# Patient Record
Sex: Female | Born: 1970 | Race: White | Hispanic: No | Smoking: Never smoker
Health system: Southern US, Community
[De-identification: ages and names within clinical notes are randomized; demographics above are authoritative.]

## PROBLEM LIST (undated history)

## (undated) DIAGNOSIS — K838 Other specified diseases of biliary tract: Secondary | ICD-10-CM

## (undated) DIAGNOSIS — K259 Gastric ulcer, unspecified as acute or chronic, without hemorrhage or perforation: Secondary | ICD-10-CM

## (undated) DIAGNOSIS — F329 Major depressive disorder, single episode, unspecified: Secondary | ICD-10-CM

## (undated) DIAGNOSIS — F419 Anxiety disorder, unspecified: Secondary | ICD-10-CM

## (undated) DIAGNOSIS — F32A Depression, unspecified: Secondary | ICD-10-CM

## (undated) DIAGNOSIS — Z9889 Other specified postprocedural states: Secondary | ICD-10-CM

## (undated) DIAGNOSIS — R112 Nausea with vomiting, unspecified: Secondary | ICD-10-CM

## (undated) HISTORY — PX: CHOLECYSTECTOMY: SHX55

## (undated) HISTORY — PX: ROTATOR CUFF REPAIR: SHX139

## (undated) HISTORY — PX: JOINT REPLACEMENT: SHX530

## (undated) HISTORY — PX: APPENDECTOMY: SHX54

## (undated) HISTORY — PX: WRIST FRACTURE SURGERY: SHX121

---

## 2016-06-06 ENCOUNTER — Ambulatory Visit: Payer: Self-pay | Admitting: Orthopedic Surgery

## 2016-06-06 NOTE — H&P (Signed)
Felicia Gordon DOB: 06/01/1970 Separated / Language: [place] / Race: White Female Date of Admission:  06/25/2016  CC:  Left knee instability History of Present Illness  The patient is a 46 year old female who comes in for a preoperative History and Physical. The patient is scheduled for a left total knee arthroplasty (revision) to be performed by Dr. Gus RankinFrank V. Aluisio, MD at [facility] on 06-25-2016. The patient is a 46 year old female who presented for follow up of their knee. The patient is being followed for their left knee pain and s/p TKA by Dr Felicia Gordon. Symptoms reported include: pain. The patient feels that they are doing well (feels better when wearing the brace). Current treatment includes: bracing (economy). Felicia DikeJennifer has been seen and followed in the clininc and it was felt as though she had an unstable total knee. We treated her with bracing and she feels it when she is wearing the brace, the knee is more comfortable. It is cumbersome to use the brace and when she does not wear it at night her knee really is unstable when she moves in bed. She does not like to wear the brace at night because it becomes uncomfortable. She is at a stage now where she feels she needs to have something done with the knee to allow her to get back to doing activities that she desires. She is encouraged that the brace helped and encouraged that if we could correct the instability that she will be able to do a lot more and tolerate this better. She is ready to proceed with surgery at this time. They have been treated conservatively in the past for the above stated problem and despite conservative measures, they continue to have progressive pain and severe functional limitations and dysfunction. They have failed non-operative management including home exercise, medications, and bracing. It is felt that they would benefit from undergoing revision of the total joint replacement. Risks and benefits of the  procedure have been discussed with the patient and they elect to proceed with surgery. There are no active contraindications to surgery such as ongoing infection or rapidly progressive neurological disease.  Problem List/Past Medical  Chronic pain of left knee (M25.562)  Chronic Ulcerative Colitis  Anxiety Disorder  Shingles  Depression  Osteopenia  Allergies NSAIDs  GI intolerance  Family History Chronic Obstructive Lung Disease  Maternal Grandfather. Depression  Mother. Heart Disease  Maternal Grandmother. Hypertension  Father, Mother. Osteoarthritis  Mother. Osteoporosis  Mother. Rheumatoid Arthritis  Sister.  Social History Children  1 Current drinker  12/07/2015: Currently drinks wine only occasionally per week Current work status  working full time Exercise  Exercises weekly; does individual sport Living situation  live alone Marital status  divorced No history of drug/alcohol rehab  Not under pain contract  Number of flights of stairs before winded  4-5 Tobacco / smoke exposure  12/07/2015: no Tobacco use  Never smoker. 12/07/2015  Medication History  Benadryl (25MG  Tablet, Oral) Active. TraZODone HCl (50MG  Tablet, Oral) Active. Atacand (8MG  Tablet, Oral) Active. Xanax (1MG  Tablet, Oral) Active. BuPROPion HCl ER (XL) (150MG  Tablet ER 24HR, Oral) Active. FLUoxetine HCl (20MG  Capsule, Oral) Active. Singulair (10MG  Tablet, Oral) Active. Myzilra (Oral) Active. ValACYclovir HCl (500MG  Tablet, Oral) Active. Omeprazole (40MG  Capsule DR, Oral) Active. Hydrocodone-Acetaminophen (5-325MG  Tablet, Oral) Active. Gabapentin (300MG  Capsule, Oral) Active. Montelukast Sodium (10MG  Tablet, Oral) Active.   Past Surgical History Appendectomy  Arthroscopy of Knee  left Arthroscopy of Shoulder  right Gallbladder Surgery  laporoscopic Rotator Cuff Repair  right Total Knee Replacement  left   Review of Systems General Not Present-  Chills, Fatigue, Fever, Memory Loss, Night Sweats, Weight Gain and Weight Loss. Skin Not Present- Eczema, Hives, Itching, Lesions and Rash. HEENT Not Present- Dentures, Double Vision, Headache, Hearing Loss, Tinnitus and Visual Loss. Respiratory Not Present- Allergies, Chronic Cough, Coughing up blood, Shortness of breath at rest and Shortness of breath with exertion. Cardiovascular Not Present- Chest Pain, Difficulty Breathing Lying Down, Murmur, Palpitations, Racing/skipping heartbeats and Swelling. Gastrointestinal Not Present- Abdominal Pain, Bloody Stool, Constipation, Diarrhea, Difficulty Swallowing, Heartburn, Jaundice, Loss of appetitie, Nausea and Vomiting. Female Genitourinary Not Present- Blood in Urine, Discharge, Flank Pain, Incontinence, Painful Urination, Urgency, Urinary frequency, Urinary Retention, Urinating at Night and Weak urinary stream. Musculoskeletal Present- Joint Pain. Not Present- Back Pain, Joint Swelling, Morning Stiffness, Muscle Pain, Muscle Weakness and Spasms. Neurological Not Present- Blackout spells, Difficulty with balance, Dizziness, Paralysis, Tremor and Weakness. Psychiatric Not Present- Insomnia.  Vitals Weight: 155 lb Height: 65in Weight was reported by patient. Height was reported by patient. Body Surface Area: 1.78 m Body Mass Index: 25.79 kg/m  Pulse: 76 (Regular)  BP: 124/86 (Sitting, Right Arm, Standard)  Physical Exam General Mental Status -Alert, cooperative and good historian. General Appearance-pleasant, Not in acute distress. Orientation-Oriented X3. Build & Nutrition-Well nourished and Well developed.  Head and Neck Head-normocephalic, atraumatic . Neck Global Assessment - supple, no bruit auscultated on the right, no bruit auscultated on the left.  Eye Pupil - Bilateral-Regular and Round. Motion - Bilateral-EOMI.  Chest and Lung Exam Auscultation Breath sounds - clear at anterior chest wall and clear  at posterior chest wall. Adventitious sounds - No Adventitious sounds.  Cardiovascular Auscultation Rhythm - Regular rate and rhythm. Heart Sounds - S1 WNL and S2 WNL. Murmurs & Other Heart Sounds - Auscultation of the heart reveals - No Murmurs.  Abdomen Palpation/Percussion Tenderness - Abdomen is non-tender to palpation. Rigidity (guarding) - Abdomen is soft. Auscultation Auscultation of the abdomen reveals - Bowel sounds normal.  Female Genitourinary Note: Not done, not pertinent to present illness   Musculoskeletal Note: On exam, she is alert and oriented, in no apparent distress. Left knee shows about 5 to 10 degrees of hyperextension, flexion down to about 125 or 130. She has a significant amount of varus, valgus and AP laxity on range of motion.  Assessment & Plan  Aftercare following left knee joint replacement surgery (Z47.1, [id number]) Chronic pain of left knee (M25.562, G89.29)  Note:Surgical Plans: Left Polyethylene Revision versus Left Total Knee Revision  Disposition: Home with family. She would [facility] straight [facility] surgery. RX provided to the patient to setup and start on Monday, February 26th.  PCP: Dr. Cranford MonNacouzi - Patient has been seen preoperatively and felt to be stable for surgery.  IV TXA  Anesthesia Issues: None  Patient was instructed on what medications to stop prior to surgery.  Signed electronically by Beckey RutterAlezandrew L Adasyn Mcadams, III PA-C

## 2016-06-12 ENCOUNTER — Ambulatory Visit: Payer: Self-pay | Admitting: Orthopedic Surgery

## 2016-06-12 NOTE — Progress Notes (Signed)
Preop on 2/13.  Need orders in epic .  Thank You

## 2016-06-13 ENCOUNTER — Ambulatory Visit: Payer: Self-pay | Admitting: Orthopedic Surgery

## 2016-06-16 NOTE — Patient Instructions (Signed)
Felicia Gordon  06/16/2016   Your procedure is scheduled on: 06/25/2016    Report to [facility]  Entrance [place]  elevators to 3rd floor to  Short Stay Center at     0715 AM.  Call this number if you have problems the morning of surgery [phone]   Remember: ONLY 1 PERSON MAY GO WITH YOU TO SHORT STAY TO GET  READY MORNING OF YOUR SURGERY.  Do not eat food or drink liquids :After Midnight.     Take these medicines the morning of surgery with A SIP OF WATER: hydrocodone if needed, prilosec                                 You may not have any metal on your body including hair pins and              piercings  Do not wear jewelry, make-up, lotions, powders or perfumes, deodorant             Do not wear nail polish.  Do not shave  48 hours prior to surgery.     Do not bring valuables to the hospital. [facility] IS NOT             RESPONSIBLE   FOR VALUABLES.  Contacts, dentures or bridgework may not be worn into surgery.  Leave suitcase in the car. After surgery it may be brought to your room.                     Please read over the following fact sheets you were given: _____________________________________________________________________             [facility] - Preparing for Surgery Before surgery, you can play an important role.  Because skin is not sterile, your skin needs to be as free of germs as possible.  You can reduce the number of germs on your skin by washing with CHG (chlorahexidine gluconate) soap before surgery.  CHG is an antiseptic cleaner which kills germs and bonds with the skin to continue killing germs even after washing. Please DO NOT use if you have an allergy to CHG or antibacterial soaps.  If your skin becomes reddened/irritated stop using the CHG and inform your nurse when you arrive at Short Stay. Do not shave (including legs and underarms) for at least 48 hours prior to the first CHG shower.  You may shave your  face/neck. Please follow these instructions carefully:  1.  Shower with CHG Soap the night before surgery and the  morning of Surgery.  2.  If you choose to wash your hair, wash your hair first as usual with your  normal  shampoo.  3.  After you shampoo, rinse your hair and body thoroughly to remove the  shampoo.                           4.  Use CHG as you would any other liquid soap.  You can apply chg directly  to the skin and wash                       Gently with a scrungie or clean washcloth.  5.  Apply the CHG Soap to your body ONLY FROM  THE NECK DOWN.   Do not use on face/ open                           Wound or open sores. Avoid contact with eyes, ears mouth and genitals (private parts).                       Wash face,  Genitals (private parts) with your normal soap.             6.  Wash thoroughly, paying special attention to the area where your surgery  will be performed.  7.  Thoroughly rinse your body with warm water from the neck down.  8.  DO NOT shower/wash with your normal soap after using and rinsing off  the CHG Soap.                9.  Pat yourself dry with a clean towel.            10.  Wear clean pajamas.            11.  Place clean sheets on your bed the night of your first shower and do not  sleep with pets. Day of Surgery : Do not apply any lotions/deodorants the morning of surgery.  Please wear clean clothes to the hospital/surgery center.  FAILURE TO FOLLOW THESE INSTRUCTIONS MAY RESULT IN THE CANCELLATION OF YOUR SURGERY PATIENT SIGNATURE_________________________________  NURSE SIGNATURE__________________________________  ________________________________________________________________________  WHAT IS A BLOOD TRANSFUSION? Blood Transfusion Information  A transfusion is the replacement of blood or some of its parts. Blood is made up of multiple cells which provide different functions.  Red blood cells carry oxygen and are used for blood loss  replacement.  White blood cells fight against infection.  Platelets control bleeding.  Plasma helps clot blood.  Other blood products are available for specialized needs, such as hemophilia or other clotting disorders. BEFORE THE TRANSFUSION  Who gives blood for transfusions?   Healthy volunteers who are fully evaluated to make sure their blood is safe. This is blood bank blood. Transfusion therapy is the safest it has ever been in the practice of medicine. Before blood is taken from a donor, a complete history is taken to make sure that person has no history of diseases nor engages in risky social behavior (examples are intravenous drug use or sexual activity with multiple partners). The donor's travel history is screened to minimize risk of transmitting infections, such as malaria. The donated blood is tested for signs of infectious diseases, such as HIV and hepatitis. The blood is then tested to be sure it is compatible with you in order to minimize the chance of a transfusion reaction. If you or a relative donates blood, this is often done in anticipation of surgery and is not appropriate for emergency situations. It takes many days to process the donated blood. RISKS AND COMPLICATIONS Although transfusion therapy is very safe and saves many lives, the main dangers of transfusion include:   Getting an infectious disease.  Developing a transfusion reaction. This is an allergic reaction to something in the blood you were given. Every precaution is taken to prevent this. The decision to have a blood transfusion has been considered carefully by your caregiver before blood is given. Blood is not given unless the benefits outweigh the risks. AFTER THE TRANSFUSION  Right after receiving a blood transfusion, you will usually feel much better and  more energetic. This is especially true if your red blood cells have gotten low (anemic). The transfusion raises the level of the red blood cells which  carry oxygen, and this usually causes an energy increase.  The nurse administering the transfusion will monitor you carefully for complications. HOME CARE INSTRUCTIONS  No special instructions are needed after a transfusion. You may find your energy is better. Speak with your caregiver about any limitations on activity for underlying diseases you may have. SEEK MEDICAL CARE IF:   Your condition is not improving after your transfusion.  You develop redness or irritation at the intravenous (IV) site. SEEK IMMEDIATE MEDICAL CARE IF:  Any of the following symptoms occur over the next 12 hours:  Shaking chills.  You have a temperature by mouth above 102 F (38.9 C), not controlled by medicine.  Chest, back, or muscle pain.  People around you feel you are not acting correctly or are confused.  Shortness of breath or difficulty breathing.  Dizziness and fainting.  You get a rash or develop hives.  You have a decrease in urine output.  Your urine turns a dark color or changes to pink, red, or brown. Any of the following symptoms occur over the next 10 days:  You have a temperature by mouth above 102 F (38.9 C), not controlled by medicine.  Shortness of breath.  Weakness after normal activity.  The white part of the eye turns yellow (jaundice).  You have a decrease in the amount of urine or are urinating less often.  Your urine turns a dark color or changes to pink, red, or brown. Document Released: 04/18/2000 Document Revised: 07/14/2011 Document Reviewed: 12/06/2007 ExitCare Patient Information 2014 [place].  _______________________________________________________________________  Incentive Spirometer  An incentive spirometer is a tool that can help keep your lungs clear and active. This tool measures how well you are filling your lungs with each breath. Taking long deep breaths may help reverse or decrease the chance of developing breathing (pulmonary) problems  (especially infection) following:  A long period of time when you are unable to move or be active. BEFORE THE PROCEDURE   If the spirometer includes an indicator to show your best effort, your nurse or respiratory therapist will set it to a desired goal.  If possible, sit up straight or lean slightly forward. Try not to slouch.  Hold the incentive spirometer in an upright position. INSTRUCTIONS FOR USE  1. Sit on the edge of your bed if possible, or sit up as far as you can in bed or on a chair. 2. Hold the incentive spirometer in an upright position. 3. Breathe out normally. 4. Place the mouthpiece in your mouth and seal your lips tightly around it. 5. Breathe in slowly and as deeply as possible, raising the piston or the ball toward the top of the column. 6. Hold your breath for 3-5 seconds or for as long as possible. Allow the piston or ball to fall to the bottom of the column. 7. Remove the mouthpiece from your mouth and breathe out normally. 8. Rest for a few seconds and repeat Steps 1 through 7 at least 10 times every 1-2 hours when you are awake. Take your time and take a few normal breaths between deep breaths. 9. The spirometer may include an indicator to show your best effort. Use the indicator as a goal to work toward during each repetition. 10. After each set of 10 deep breaths, practice coughing to be sure your lungs  are clear. If you have an incision (the cut made at the time of surgery), support your incision when coughing by placing a pillow or rolled up towels firmly against it. Once you are able to get out of bed, walk around indoors and cough well. You may stop using the incentive spirometer when instructed by your caregiver.  RISKS AND COMPLICATIONS  Take your time so you do not get dizzy or light-headed.  If you are in pain, you may need to take or ask for pain medication before doing incentive spirometry. It is harder to take a deep breath if you are having  pain. AFTER USE  Rest and breathe slowly and easily.  It can be helpful to keep track of a log of your progress. Your caregiver can provide you with a simple table to help with this. If you are using the spirometer at home, follow these instructions: [place] IF:   You are having difficultly using the spirometer.  You have trouble using the spirometer as often as instructed.  Your pain medication is not giving enough relief while using the spirometer.  You develop fever of 100.5 F (38.1 C) or higher. SEEK IMMEDIATE MEDICAL CARE IF:   You cough up bloody sputum that had not been present before.  You develop fever of 102 F (38.9 C) or greater.  You develop worsening pain at or near the incision site. MAKE SURE YOU:   Understand these instructions.  Will watch your condition.  Will get help right away if you are not doing well or get worse. Document Released: 09/01/2006 Document Revised: 07/14/2011 Document Reviewed: 11/02/2006 [facility] Patient Information 2014 [place], [place].   ________________________________________________________________________

## 2016-06-17 ENCOUNTER — Inpatient Hospital Stay (HOSPITAL_COMMUNITY)
Admission: RE | Admit: 2016-06-17 | Discharge: 2016-06-17 | Disposition: A | Discharge status: Home / Self Care | Payer: Self-pay | Source: Ambulatory Visit

## 2016-06-17 NOTE — Patient Instructions (Addendum)
Felicia Gordon  06/17/2016   Your procedure is scheduled on: 06/25/16  [facility]  Entrance [place]  elevators to 3rd floor to  Short Stay Center at     [phone].  Call this number if you have problems the morning of surgery [phone]   Remember: ONLY 1 PERSON MAY GO WITH YOU TO SHORT STAY TO GET  READY MORNING OF YOUR SURGERY.  Do not eat food or drink liquids :After Midnight.     Take these medicines the morning of surgery with A SIP OF WATER: prilosec, hydrocodone if needed,                                 You may not have any metal on your body including hair pins and              piercings  Do not wear jewelry, make-up, lotions, powders or perfumes, deodorant             Do not wear nail polish.  Do not shave  48 hours prior to surgery.               Do not bring valuables to the hospital. [facility] IS NOT             RESPONSIBLE   FOR VALUABLES.  Contacts, dentures or bridgework may not be worn into surgery.  Leave suitcase in the car. After surgery it may be brought to your room.                  Please read over the following fact sheets you were given: _____________________________________________________________________             [facility] Pre[facility] Before surgery, you can play an important role.  Because skin is not sterile, your skin needs to be as free of germs as possible.  You can reduce the number of germs on your skin by washing with CHG (chlorahexidine gluconate) soap before surgery.  CHG is an antiseptic cleaner which kills germs and bonds with the skin to continue killing germs even after washing. Please DO NOT use if you have an allergy to CHG or antibacterial soaps.  If your skin becomes reddened/irritated stop using the CHG and inform your nurse when you arrive at Short Stay. Do not shave (including legs and underarms) for at least 48 hours prior to the first CHG shower.  You may shave your  face/neck. Please follow these instructions carefully:  1.  Shower with CHG Soap the night before surgery and the  morning of Surgery.  2.  If you choose to wash your hair, wash your hair first as usual with your  normal  shampoo.  3.  After you shampoo, rinse your hair and body thoroughly to remove the  shampoo.                           4.  Use CHG as you would any other liquid soap.  You can apply chg directly  to the skin and wash                       Gently with a scrungie or clean washcloth.  5.  Apply the CHG Soap to  your body ONLY FROM THE NECK DOWN.   Do not use on face/ open                           Wound or open sores. Avoid contact with eyes, ears mouth and genitals (private parts).                       Wash face,  Genitals (private parts) with your normal soap.             6.  Wash thoroughly, paying special attention to the area where your surgery  will be performed.  7.  Thoroughly rinse your body with warm water from the neck down.  8.  DO NOT shower/wash with your normal soap after using and rinsing off  the CHG Soap.                9.  Pat yourself dry with a clean towel.            10.  Wear clean pajamas.            11.  Place clean sheets on your bed the night of your first shower and do not  sleep with pets. Day of Surgery : Do not apply any lotions/deodorants the morning of surgery.  Please wear clean clothes to the hospital/surgery center.  FAILURE TO FOLLOW THESE INSTRUCTIONS MAY RESULT IN THE CANCELLATION OF YOUR SURGERY PATIENT SIGNATURE_________________________________  NURSE SIGNATURE__________________________________  ________________________________________________________________________  WHAT IS A BLOOD TRANSFUSION? Blood Transfusion Information  A transfusion is the replacement of blood or some of its parts. Blood is made up of multiple cells which provide different functions.  Red blood cells carry oxygen and are used for blood loss  replacement.  White blood cells fight against infection.  Platelets control bleeding.  Plasma helps clot blood.  Other blood products are available for specialized needs, such as hemophilia or other clotting disorders. BEFORE THE TRANSFUSION  Who gives blood for transfusions?   Healthy volunteers who are fully evaluated to make sure their blood is safe. This is blood bank blood. Transfusion therapy is the safest it has ever been in the practice of medicine. Before blood is taken from a donor, a complete history is taken to make sure that person has no history of diseases nor engages in risky social behavior (examples are intravenous drug use or sexual activity with multiple partners). The donor's travel history is screened to minimize risk of transmitting infections, such as malaria. The donated blood is tested for signs of infectious diseases, such as HIV and hepatitis. The blood is then tested to be sure it is compatible with you in order to minimize the chance of a transfusion reaction. If you or a relative donates blood, this is often done in anticipation of surgery and is not appropriate for emergency situations. It takes many days to process the donated blood. RISKS AND COMPLICATIONS Although transfusion therapy is very safe and saves many lives, the main dangers of transfusion include:   Getting an infectious disease.  Developing a transfusion reaction. This is an allergic reaction to something in the blood you were given. Every precaution is taken to prevent this. The decision to have a blood transfusion has been considered carefully by your caregiver before blood is given. Blood is not given unless the benefits outweigh the risks. AFTER THE TRANSFUSION  Right after receiving a blood transfusion, you will usually  feel much better and more energetic. This is especially true if your red blood cells have gotten low (anemic). The transfusion raises the level of the red blood cells which  carry oxygen, and this usually causes an energy increase.  The nurse administering the transfusion will monitor you carefully for complications. HOME CARE INSTRUCTIONS  No special instructions are needed after a transfusion. You may find your energy is better. Speak with your caregiver about any limitations on activity for underlying diseases you may have. SEEK MEDICAL CARE IF:   Your condition is not improving after your transfusion.  You develop redness or irritation at the intravenous (IV) site. SEEK IMMEDIATE MEDICAL CARE IF:  Any of the following symptoms occur over the next 12 hours:  Shaking chills.  You have a temperature by mouth above 102 F (38.9 C), not controlled by medicine.  Chest, back, or muscle pain.  People around you feel you are not acting correctly or are confused.  Shortness of breath or difficulty breathing.  Dizziness and fainting.  You get a rash or develop hives.  You have a decrease in urine output.  Your urine turns a dark color or changes to pink, red, or brown. Any of the following symptoms occur over the next 10 days:  You have a temperature by mouth above 102 F (38.9 C), not controlled by medicine.  Shortness of breath.  Weakness after normal activity.  The white part of the eye turns yellow (jaundice).  You have a decrease in the amount of urine or are urinating less often.  Your urine turns a dark color or changes to pink, red, or brown. Document Released: 04/18/2000 Document Revised: 07/14/2011 Document Reviewed: 12/06/2007 ExitCare Patient Information 2014 [place].  _______________________________________________________________________  Incentive Spirometer  An incentive spirometer is a tool that can help keep your lungs clear and active. This tool measures how well you are filling your lungs with each breath. Taking long deep breaths may help reverse or decrease the chance of developing breathing (pulmonary) problems  (especially infection) following:  A long period of time when you are unable to move or be active. BEFORE THE PROCEDURE   If the spirometer includes an indicator to show your best effort, your nurse or respiratory therapist will set it to a desired goal.  If possible, sit up straight or lean slightly forward. Try not to slouch.  Hold the incentive spirometer in an upright position. INSTRUCTIONS FOR USE  1. Sit on the edge of your bed if possible, or sit up as far as you can in bed or on a chair. 2. Hold the incentive spirometer in an upright position. 3. Breathe out normally. 4. Place the mouthpiece in your mouth and seal your lips tightly around it. 5. Breathe in slowly and as deeply as possible, raising the piston or the ball toward the top of the column. 6. Hold your breath for 3-5 seconds or for as long as possible. Allow the piston or ball to fall to the bottom of the column. 7. Remove the mouthpiece from your mouth and breathe out normally. 8. Rest for a few seconds and repeat Steps 1 through 7 at least 10 times every 1-2 hours when you are awake. Take your time and take a few normal breaths between deep breaths. 9. The spirometer may include an indicator to show your best effort. Use the indicator as a goal to work toward during each repetition. 10. After each set of 10 deep breaths, practice coughing to  be sure your lungs are clear. If you have an incision (the cut made at the time of surgery), support your incision when coughing by placing a pillow or rolled up towels firmly against it. Once you are able to get out of bed, walk around indoors and cough well. You may stop using the incentive spirometer when instructed by your caregiver.  RISKS AND COMPLICATIONS  Take your time so you do not get dizzy or light-headed.  If you are in pain, you may need to take or ask for pain medication before doing incentive spirometry. It is harder to take a deep breath if you are having  pain. AFTER USE  Rest and breathe slowly and easily.  It can be helpful to keep track of a log of your progress. Your caregiver can provide you with a simple table to help with this. If you are using the spirometer at home, follow these instructions: [place] IF:   You are having difficultly using the spirometer.  You have trouble using the spirometer as often as instructed.  Your pain medication is not giving enough relief while using the spirometer.  You develop fever of 100.5 F (38.1 C) or higher. SEEK IMMEDIATE MEDICAL CARE IF:   You cough up bloody sputum that had not been present before.  You develop fever of 102 F (38.9 C) or greater.  You develop worsening pain at or near the incision site. MAKE SURE YOU:   Understand these instructions.  Will watch your condition.  Will get help right away if you are not doing well or get worse. Document Released: 09/01/2006 Document Revised: 07/14/2011 Document Reviewed: 11/02/2006 [facility] Patient Information 2014 [place], [place].   ________________________________________________________________________

## 2016-06-19 ENCOUNTER — Encounter (HOSPITAL_COMMUNITY)
Admission: RE | Admit: 2016-06-19 | Discharge: 2016-06-19 | Disposition: A | Discharge status: Home / Self Care | Payer: BLUE CROSS/BLUE SHIELD | Source: Ambulatory Visit | Attending: Orthopedic Surgery | Admitting: Orthopedic Surgery

## 2016-06-19 ENCOUNTER — Ambulatory Visit (HOSPITAL_COMMUNITY)
Admission: RE | Admit: 2016-06-19 | Discharge: 2016-06-19 | Disposition: A | Discharge status: Home / Self Care | Payer: BLUE CROSS/BLUE SHIELD | Source: Ambulatory Visit | Attending: Anesthesiology | Admitting: Anesthesiology

## 2016-06-19 ENCOUNTER — Encounter (HOSPITAL_COMMUNITY): Payer: Self-pay

## 2016-06-19 ENCOUNTER — Encounter (INDEPENDENT_AMBULATORY_CARE_PROVIDER_SITE_OTHER): Payer: Self-pay

## 2016-06-19 DIAGNOSIS — R05 Cough: Secondary | ICD-10-CM | POA: Diagnosis present

## 2016-06-19 DIAGNOSIS — R059 Cough, unspecified: Secondary | ICD-10-CM

## 2016-06-19 HISTORY — DX: Nausea with vomiting, unspecified: R11.2

## 2016-06-19 HISTORY — DX: Other specified postprocedural states: Z98.890

## 2016-06-19 HISTORY — DX: Major depressive disorder, single episode, unspecified: F32.9

## 2016-06-19 HISTORY — DX: Anxiety disorder, unspecified: F41.9

## 2016-06-19 HISTORY — DX: Depression, unspecified: F32.A

## 2016-06-19 HISTORY — DX: Gastric ulcer, unspecified as acute or chronic, without hemorrhage or perforation: K25.9

## 2016-06-19 LAB — COMPREHENSIVE METABOLIC PANEL
ALT: 67 U/L — ABNORMAL HIGH (ref 14–54)
AST: 33 U/L (ref 15–41)
Albumin: 4 g/dL (ref 3.5–5.0)
Alkaline Phosphatase: 62 U/L (ref 38–126)
Anion gap: 10 (ref 5–15)
BUN: 9 mg/dL (ref 6–20)
CHLORIDE: 103 mmol/L (ref 101–111)
CO2: 30 mmol/L (ref 22–32)
CREATININE: 0.62 mg/dL (ref 0.44–1.00)
Calcium: 9 mg/dL (ref 8.9–10.3)
Glucose, Bld: 97 mg/dL (ref 65–99)
POTASSIUM: 3.5 mmol/L (ref 3.5–5.1)
Sodium: 143 mmol/L (ref 135–145)
TOTAL PROTEIN: 6.8 g/dL (ref 6.5–8.1)
Total Bilirubin: 0.4 mg/dL (ref 0.3–1.2)

## 2016-06-19 LAB — APTT: aPTT: 28 seconds (ref 24–36)

## 2016-06-19 LAB — HCG, SERUM, QUALITATIVE: Preg, Serum: NEGATIVE

## 2016-06-19 LAB — PROTIME-INR
INR: 0.96
PROTHROMBIN TIME: 12.8 s (ref 11.4–15.2)

## 2016-06-19 LAB — CBC
HCT: 40.3 % (ref 36.0–46.0)
Hemoglobin: 13.6 g/dL (ref 12.0–15.0)
MCH: 30.9 pg (ref 26.0–34.0)
MCHC: 33.7 g/dL (ref 30.0–36.0)
MCV: 91.6 fL (ref 78.0–100.0)
PLATELETS: 252 10*3/uL (ref 150–400)
RBC: 4.4 MIL/uL (ref 3.87–5.11)
RDW: 12.4 % (ref 11.5–15.5)
WBC: 4.4 10*3/uL (ref 4.0–10.5)

## 2016-06-19 LAB — SURGICAL PCR SCREEN
MRSA, PCR: NEGATIVE
STAPHYLOCOCCUS AUREUS: NEGATIVE

## 2016-06-19 LAB — ABO/RH: ABO/RH(D): O POS

## 2016-06-19 NOTE — Progress Notes (Signed)
CXR done 06/19/16 routed to Dr. Lequita HaltAluisio via epic

## 2016-06-24 NOTE — Anesthesia Preprocedure Evaluation (Addendum)
Anesthesia Evaluation  Patient identified by MRN, date of birth, ID band Patient awake    Reviewed: Allergy & Precautions, NPO status , Patient's Chart, lab work & pertinent test results  History of Anesthesia Complications (+) PONV  Airway Mallampati: II  TM Distance: >3 FB Neck ROM: Full    Dental  (+) Dental Advisory Given   Pulmonary neg pulmonary ROS,    breath sounds clear to auscultation       Cardiovascular negative cardio ROS   Rhythm:Regular Rate:Normal     Neuro/Psych Anxiety Depression negative neurological ROS     GI/Hepatic Neg liver ROS, PUD,   Endo/Other  negative endocrine ROS  Renal/GU negative Renal ROS     Musculoskeletal   Abdominal   Peds  Hematology negative hematology ROS (+)   Anesthesia Other Findings   Reproductive/Obstetrics                            Lab Results  Component Value Date   WBC 4.4 06/19/2016   HGB 13.6 06/19/2016   HCT 40.3 06/19/2016   MCV 91.6 06/19/2016   PLT 252 06/19/2016   Lab Results  Component Value Date   CREATININE 0.62 06/19/2016   BUN 9 06/19/2016   NA 143 06/19/2016   K 3.5 06/19/2016   CL 103 06/19/2016   CO2 30 06/19/2016   Lab Results  Component Value Date   INR 0.96 06/19/2016    Anesthesia Physical Anesthesia Plan  ASA: II  Anesthesia Plan: General   Post-op Pain Management:  Regional for Post-op pain   Induction: Intravenous  Airway Management Planned: LMA  Additional Equipment:   Intra-op Plan:   Post-operative Plan: Extubation in OR  Informed Consent: I have reviewed the patients History and Physical, chart, labs and discussed the procedure including the risks, benefits and alternatives for the proposed anesthesia with the patient or authorized representative who has indicated his/her understanding and acceptance.   Dental advisory given  Plan Discussed with:   Anesthesia Plan Comments:         Anesthesia Quick Evaluation

## 2016-06-25 ENCOUNTER — Inpatient Hospital Stay (HOSPITAL_COMMUNITY)
Admission: RE | Admit: 2016-06-25 | Discharge: 2016-06-27 | DRG: 489 | Disposition: A | Discharge status: Home / Self Care | Payer: BLUE CROSS/BLUE SHIELD | Source: Ambulatory Visit | Attending: Orthopedic Surgery | Admitting: Orthopedic Surgery

## 2016-06-25 ENCOUNTER — Inpatient Hospital Stay (HOSPITAL_COMMUNITY): Payer: BLUE CROSS/BLUE SHIELD | Admitting: Anesthesiology

## 2016-06-25 ENCOUNTER — Encounter (HOSPITAL_COMMUNITY): Payer: Self-pay

## 2016-06-25 ENCOUNTER — Encounter (HOSPITAL_COMMUNITY)
Admission: RE | Disposition: A | Discharge status: Home / Self Care | Payer: Self-pay | Source: Ambulatory Visit | Attending: Orthopedic Surgery

## 2016-06-25 DIAGNOSIS — Z96659 Presence of unspecified artificial knee joint: Secondary | ICD-10-CM

## 2016-06-25 DIAGNOSIS — T84018A Broken internal joint prosthesis, other site, initial encounter: Secondary | ICD-10-CM

## 2016-06-25 DIAGNOSIS — Z79899 Other long term (current) drug therapy: Secondary | ICD-10-CM

## 2016-06-25 DIAGNOSIS — F329 Major depressive disorder, single episode, unspecified: Secondary | ICD-10-CM | POA: Diagnosis present

## 2016-06-25 DIAGNOSIS — M25362 Other instability, left knee: Secondary | ICD-10-CM | POA: Diagnosis present

## 2016-06-25 DIAGNOSIS — T84018S Broken internal joint prosthesis, other site, sequela: Secondary | ICD-10-CM

## 2016-06-25 DIAGNOSIS — Y831 Surgical operation with implant of artificial internal device as the cause of abnormal reaction of the patient, or of later complication, without mention of misadventure at the time of the procedure: Secondary | ICD-10-CM | POA: Diagnosis present

## 2016-06-25 DIAGNOSIS — T84023A Instability of internal left knee prosthesis, initial encounter: Principal | ICD-10-CM | POA: Diagnosis present

## 2016-06-25 DIAGNOSIS — F419 Anxiety disorder, unspecified: Secondary | ICD-10-CM | POA: Diagnosis present

## 2016-06-25 HISTORY — PX: TOTAL KNEE REVISION: SHX996

## 2016-06-25 LAB — TYPE AND SCREEN
ABO/RH(D): O POS
ANTIBODY SCREEN: NEGATIVE

## 2016-06-25 SURGERY — TOTAL KNEE REVISION
Anesthesia: General | Site: Knee | Laterality: Left

## 2016-06-25 MED ORDER — DEXAMETHASONE SODIUM PHOSPHATE 10 MG/ML IJ SOLN
10.0000 mg | Freq: Once | INTRAMUSCULAR | Status: AC
  Administered 2016-06-26: 10 mg via INTRAVENOUS
  Filled 2016-06-25: qty 1

## 2016-06-25 MED ORDER — PHENOL 1.4 % MT LIQD: 1.0000 | OROMUCOSAL | Status: DC | PRN

## 2016-06-25 MED ORDER — LIDOCAINE 2% (20 MG/ML) 5 ML SYRINGE
INTRAMUSCULAR | Status: AC
  Filled 2016-06-25: qty 5

## 2016-06-25 MED ORDER — PROMETHAZINE HCL 25 MG/ML IJ SOLN: 6.2500 mg | INTRAMUSCULAR | Status: DC | PRN

## 2016-06-25 MED ORDER — DIPHENHYDRAMINE HCL 25 MG PO CAPS
25.0000 mg | ORAL_CAPSULE | Freq: Every day | ORAL | Status: DC
  Administered 2016-06-25 – 2016-06-26 (×2): 25 mg via ORAL
  Filled 2016-06-25 (×3): qty 1

## 2016-06-25 MED ORDER — ALPRAZOLAM 0.5 MG PO TABS
0.5000 mg | ORAL_TABLET | Freq: Every evening | ORAL | Status: DC | PRN
  Administered 2016-06-26: 0.5 mg via ORAL
  Filled 2016-06-25: qty 1

## 2016-06-25 MED ORDER — SCOPOLAMINE 1 MG/3DAYS TD PT72
1.0000 | MEDICATED_PATCH | Freq: Once | TRANSDERMAL | Status: DC
  Administered 2016-06-25: 1.5 mg via TRANSDERMAL

## 2016-06-25 MED ORDER — SODIUM CHLORIDE 0.9 % IJ SOLN
INTRAMUSCULAR | Status: DC | PRN
  Administered 2016-06-25: 30 mL

## 2016-06-25 MED ORDER — ACETAMINOPHEN 10 MG/ML IV SOLN
INTRAVENOUS | Status: AC
  Filled 2016-06-25: qty 100

## 2016-06-25 MED ORDER — FLUOXETINE HCL 20 MG PO CAPS
20.0000 mg | ORAL_CAPSULE | Freq: Every day | ORAL | Status: DC
  Administered 2016-06-25 – 2016-06-26 (×2): 20 mg via ORAL
  Filled 2016-06-25 (×2): qty 1

## 2016-06-25 MED ORDER — CHLORHEXIDINE GLUCONATE 4 % EX LIQD: 60.0000 mL | Freq: Once | CUTANEOUS | Status: DC

## 2016-06-25 MED ORDER — METOCLOPRAMIDE HCL 5 MG/ML IJ SOLN: 5.0000 mg | Freq: Three times a day (TID) | INTRAMUSCULAR | Status: DC | PRN

## 2016-06-25 MED ORDER — MENTHOL 3 MG MT LOZG: 1.0000 | LOZENGE | OROMUCOSAL | Status: DC | PRN

## 2016-06-25 MED ORDER — CEFAZOLIN SODIUM-DEXTROSE 2-4 GM/100ML-% IV SOLN
INTRAVENOUS | Status: AC
  Filled 2016-06-25: qty 100

## 2016-06-25 MED ORDER — ONDANSETRON HCL 4 MG/2ML IJ SOLN
INTRAMUSCULAR | Status: AC
  Filled 2016-06-25: qty 2

## 2016-06-25 MED ORDER — PROPOFOL 10 MG/ML IV BOLUS
INTRAVENOUS | Status: DC | PRN
  Administered 2016-06-25: 200 mg via INTRAVENOUS

## 2016-06-25 MED ORDER — FLEET ENEMA 7-19 GM/118ML RE ENEM: 1.0000 | ENEMA | Freq: Once | RECTAL | Status: DC | PRN

## 2016-06-25 MED ORDER — MIDAZOLAM HCL 2 MG/2ML IJ SOLN
1.0000 mg | INTRAMUSCULAR | Status: DC | PRN
  Administered 2016-06-25 (×2): 1 mg via INTRAVENOUS
  Filled 2016-06-25: qty 2

## 2016-06-25 MED ORDER — HYDROMORPHONE HCL 1 MG/ML IJ SOLN
0.2500 mg | INTRAMUSCULAR | Status: DC | PRN
  Administered 2016-06-25 (×4): 0.5 mg via INTRAVENOUS

## 2016-06-25 MED ORDER — BISACODYL 10 MG RE SUPP: 10.0000 mg | Freq: Every day | RECTAL | Status: DC | PRN

## 2016-06-25 MED ORDER — ONDANSETRON HCL 4 MG PO TABS
4.0000 mg | ORAL_TABLET | Freq: Four times a day (QID) | ORAL | Status: DC | PRN
  Administered 2016-06-26: 4 mg via ORAL
  Filled 2016-06-25: qty 1

## 2016-06-25 MED ORDER — HYDROMORPHONE HCL 1 MG/ML IJ SOLN
INTRAMUSCULAR | Status: AC
  Administered 2016-06-25: 0.5 mg via INTRAVENOUS
  Filled 2016-06-25: qty 1

## 2016-06-25 MED ORDER — ACETAMINOPHEN 650 MG RE SUPP: 650.0000 mg | Freq: Four times a day (QID) | RECTAL | Status: DC | PRN

## 2016-06-25 MED ORDER — KETAMINE HCL 10 MG/ML IJ SOLN
INTRAMUSCULAR | Status: DC | PRN
  Administered 2016-06-25: 35 mg via INTRAVENOUS

## 2016-06-25 MED ORDER — FENTANYL CITRATE (PF) 100 MCG/2ML IJ SOLN
INTRAMUSCULAR | Status: DC | PRN
  Administered 2016-06-25 (×2): 50 ug via INTRAVENOUS

## 2016-06-25 MED ORDER — FENTANYL CITRATE (PF) 100 MCG/2ML IJ SOLN
INTRAMUSCULAR | Status: AC
Start: 2016-06-25 — End: 2016-06-25
  Filled 2016-06-25: qty 2

## 2016-06-25 MED ORDER — ROPIVACAINE HCL 7.5 MG/ML IJ SOLN
INTRAMUSCULAR | Status: DC | PRN
  Administered 2016-06-25: 20 mL via PERINEURAL

## 2016-06-25 MED ORDER — HYDROMORPHONE HCL 1 MG/ML IJ SOLN
INTRAMUSCULAR | Status: DC | PRN
  Administered 2016-06-25: 1 mg via INTRAVENOUS
  Administered 2016-06-25: 0.5 mg via INTRAVENOUS

## 2016-06-25 MED ORDER — PANTOPRAZOLE SODIUM 40 MG PO TBEC
80.0000 mg | DELAYED_RELEASE_TABLET | Freq: Every day | ORAL | Status: DC
  Administered 2016-06-26: 80 mg via ORAL
  Filled 2016-06-25: qty 2

## 2016-06-25 MED ORDER — BUPIVACAINE LIPOSOME 1.3 % IJ SUSP
INTRAMUSCULAR | Status: DC | PRN
  Administered 2016-06-25: 20 mL

## 2016-06-25 MED ORDER — TRAZODONE HCL 50 MG PO TABS
50.0000 mg | ORAL_TABLET | Freq: Every day | ORAL | Status: DC
  Administered 2016-06-25 – 2016-06-26 (×2): 50 mg via ORAL
  Filled 2016-06-25: qty 2
  Filled 2016-06-25: qty 1

## 2016-06-25 MED ORDER — ACETAMINOPHEN 500 MG PO TABS
1000.0000 mg | ORAL_TABLET | Freq: Four times a day (QID) | ORAL | Status: AC
  Administered 2016-06-25 – 2016-06-26 (×4): 1000 mg via ORAL
  Filled 2016-06-25 (×4): qty 2

## 2016-06-25 MED ORDER — BUPROPION HCL ER (SR) 150 MG PO TB12
150.0000 mg | ORAL_TABLET | Freq: Every day | ORAL | Status: DC
  Administered 2016-06-25 – 2016-06-26 (×2): 150 mg via ORAL
  Filled 2016-06-25 (×2): qty 1

## 2016-06-25 MED ORDER — DEXAMETHASONE SODIUM PHOSPHATE 10 MG/ML IJ SOLN
INTRAMUSCULAR | Status: AC
  Filled 2016-06-25: qty 1

## 2016-06-25 MED ORDER — MONTELUKAST SODIUM 10 MG PO TABS
10.0000 mg | ORAL_TABLET | Freq: Every day | ORAL | Status: DC
  Administered 2016-06-25 – 2016-06-26 (×2): 10 mg via ORAL
  Filled 2016-06-25 (×2): qty 1

## 2016-06-25 MED ORDER — SODIUM CHLORIDE 0.9 % IJ SOLN
INTRAMUSCULAR | Status: AC
  Filled 2016-06-25: qty 50

## 2016-06-25 MED ORDER — DIPHENHYDRAMINE HCL 12.5 MG/5ML PO ELIX: 12.5000 mg | ORAL_SOLUTION | ORAL | Status: DC | PRN

## 2016-06-25 MED ORDER — RIVAROXABAN 10 MG PO TABS
10.0000 mg | ORAL_TABLET | Freq: Every day | ORAL | Status: DC
  Administered 2016-06-26 – 2016-06-27 (×2): 10 mg via ORAL
  Filled 2016-06-25 (×2): qty 1

## 2016-06-25 MED ORDER — ACETAMINOPHEN 10 MG/ML IV SOLN
1000.0000 mg | Freq: Once | INTRAVENOUS | Status: AC
  Administered 2016-06-25: 1000 mg via INTRAVENOUS

## 2016-06-25 MED ORDER — ONDANSETRON HCL 4 MG/2ML IJ SOLN
INTRAMUSCULAR | Status: DC | PRN
  Administered 2016-06-25: 4 mg via INTRAVENOUS

## 2016-06-25 MED ORDER — DOCUSATE SODIUM 100 MG PO CAPS
100.0000 mg | ORAL_CAPSULE | Freq: Two times a day (BID) | ORAL | Status: DC
  Administered 2016-06-25 – 2016-06-27 (×4): 100 mg via ORAL
  Filled 2016-06-25 (×4): qty 1

## 2016-06-25 MED ORDER — METHOCARBAMOL 500 MG PO TABS
500.0000 mg | ORAL_TABLET | Freq: Four times a day (QID) | ORAL | Status: DC | PRN
  Administered 2016-06-25 – 2016-06-27 (×7): 500 mg via ORAL
  Filled 2016-06-25 (×7): qty 1

## 2016-06-25 MED ORDER — ONDANSETRON HCL 4 MG/2ML IJ SOLN: 4.0000 mg | Freq: Four times a day (QID) | INTRAMUSCULAR | Status: DC | PRN

## 2016-06-25 MED ORDER — BUPIVACAINE LIPOSOME 1.3 % IJ SUSP
20.0000 mL | Freq: Once | INTRAMUSCULAR | Status: DC
  Filled 2016-06-25: qty 20

## 2016-06-25 MED ORDER — GABAPENTIN 300 MG PO CAPS
600.0000 mg | ORAL_CAPSULE | Freq: Every day | ORAL | Status: DC
  Administered 2016-06-25 – 2016-06-26 (×2): 600 mg via ORAL
  Filled 2016-06-25 (×2): qty 2

## 2016-06-25 MED ORDER — POLYETHYLENE GLYCOL 3350 17 G PO PACK: 17.0000 g | PACK | Freq: Every day | ORAL | Status: DC | PRN

## 2016-06-25 MED ORDER — SODIUM CHLORIDE 0.9 % IR SOLN
Status: DC | PRN
  Administered 2016-06-25: 1000 mL

## 2016-06-25 MED ORDER — CEFAZOLIN SODIUM-DEXTROSE 2-4 GM/100ML-% IV SOLN
2.0000 g | Freq: Four times a day (QID) | INTRAVENOUS | Status: AC
  Administered 2016-06-25 (×2): 2 g via INTRAVENOUS
  Filled 2016-06-25 (×2): qty 100

## 2016-06-25 MED ORDER — METHOCARBAMOL 1000 MG/10ML IJ SOLN
500.0000 mg | Freq: Four times a day (QID) | INTRAVENOUS | Status: DC | PRN
  Administered 2016-06-25: 500 mg via INTRAVENOUS
  Filled 2016-06-25: qty 550
  Filled 2016-06-25: qty 5

## 2016-06-25 MED ORDER — HYDROMORPHONE HCL 2 MG/ML IJ SOLN
INTRAMUSCULAR | Status: AC
  Filled 2016-06-25: qty 1

## 2016-06-25 MED ORDER — LIDOCAINE HCL (CARDIAC) 20 MG/ML IV SOLN
INTRAVENOUS | Status: DC | PRN
  Administered 2016-06-25: 20 mg via INTRAVENOUS

## 2016-06-25 MED ORDER — SCOPOLAMINE 1 MG/3DAYS TD PT72
MEDICATED_PATCH | TRANSDERMAL | Status: AC
  Filled 2016-06-25: qty 1

## 2016-06-25 MED ORDER — CEFAZOLIN SODIUM-DEXTROSE 2-4 GM/100ML-% IV SOLN
2.0000 g | INTRAVENOUS | Status: AC
  Administered 2016-06-25: 2 g via INTRAVENOUS

## 2016-06-25 MED ORDER — METOCLOPRAMIDE HCL 5 MG PO TABS: 5.0000 mg | ORAL_TABLET | Freq: Three times a day (TID) | ORAL | Status: DC | PRN

## 2016-06-25 MED ORDER — TRANEXAMIC ACID 1000 MG/10ML IV SOLN
1000.0000 mg | INTRAVENOUS | Status: AC
  Administered 2016-06-25: 1000 mg via INTRAVENOUS
  Filled 2016-06-25: qty 1100

## 2016-06-25 MED ORDER — ACETAMINOPHEN 325 MG PO TABS: 650.0000 mg | ORAL_TABLET | Freq: Four times a day (QID) | ORAL | Status: DC | PRN

## 2016-06-25 MED ORDER — DEXAMETHASONE SODIUM PHOSPHATE 10 MG/ML IJ SOLN
10.0000 mg | Freq: Once | INTRAMUSCULAR | Status: AC
  Administered 2016-06-25: 10 mg via INTRAVENOUS

## 2016-06-25 MED ORDER — LACTATED RINGERS IV SOLN
INTRAVENOUS | Status: DC
  Administered 2016-06-25: 1000 mL via INTRAVENOUS

## 2016-06-25 MED ORDER — MORPHINE SULFATE (PF) 4 MG/ML IV SOLN
1.0000 mg | INTRAVENOUS | Status: DC | PRN
  Administered 2016-06-25 – 2016-06-27 (×10): 1 mg via INTRAVENOUS
  Filled 2016-06-25 (×10): qty 1

## 2016-06-25 MED ORDER — OXYCODONE HCL 5 MG PO TABS
5.0000 mg | ORAL_TABLET | ORAL | Status: DC | PRN
  Administered 2016-06-25 – 2016-06-26 (×7): 10 mg via ORAL
  Filled 2016-06-25 (×7): qty 2

## 2016-06-25 MED ORDER — KETAMINE HCL 10 MG/ML IJ SOLN
INTRAMUSCULAR | Status: AC
  Filled 2016-06-25: qty 1

## 2016-06-25 MED ORDER — MIDAZOLAM HCL 2 MG/2ML IJ SOLN
INTRAMUSCULAR | Status: AC
  Filled 2016-06-25: qty 2

## 2016-06-25 MED ORDER — PROPOFOL 10 MG/ML IV BOLUS
INTRAVENOUS | Status: AC
  Filled 2016-06-25: qty 20

## 2016-06-25 MED ORDER — SODIUM CHLORIDE 0.9 % IV SOLN
INTRAVENOUS | Status: DC
  Administered 2016-06-25: 12:00:00 via INTRAVENOUS

## 2016-06-25 MED ORDER — BUPIVACAINE HCL (PF) 0.25 % IJ SOLN
INTRAMUSCULAR | Status: AC
  Filled 2016-06-25: qty 30

## 2016-06-25 SURGICAL SUPPLY — 46 items
BAG DECANTER FOR FLEXI CONT (MISCELLANEOUS) ×2 IMPLANT
BAG ZIPLOCK 12X15 (MISCELLANEOUS) IMPLANT
BANDAGE ACE 6X5 VEL STRL LF (GAUZE/BANDAGES/DRESSINGS) ×2 IMPLANT
BLADE SAG 18X100X1.27 (BLADE) ×2 IMPLANT
BLADE SAW SGTL 11.0X1.19X90.0M (BLADE) ×2 IMPLANT
CLOTH BEACON ORANGE TIMEOUT ST (SAFETY) ×2 IMPLANT
CUFF TOURN SGL QUICK 34 (TOURNIQUET CUFF) ×1
CUFF TRNQT CYL 34X4X40X1 (TOURNIQUET CUFF) ×1 IMPLANT
DRAPE U-SHAPE 47X51 STRL (DRAPES) ×2 IMPLANT
DRSG ADAPTIC 3X8 NADH LF (GAUZE/BANDAGES/DRESSINGS) ×2 IMPLANT
DRSG PAD ABDOMINAL 8X10 ST (GAUZE/BANDAGES/DRESSINGS) ×2 IMPLANT
DURAPREP 26ML APPLICATOR (WOUND CARE) ×2 IMPLANT
ELECT REM PT RETURN 9FT ADLT (ELECTROSURGICAL) ×2
ELECTRODE REM PT RTRN 9FT ADLT (ELECTROSURGICAL) ×1 IMPLANT
EVACUATOR 1/8 PVC DRAIN (DRAIN) ×2 IMPLANT
FENDER SOL KNEE FLEX SYS (Orthopedic Implant) ×2 IMPLANT
GAUZE SPONGE 4X4 12PLY STRL (GAUZE/BANDAGES/DRESSINGS) ×2 IMPLANT
GLOVE BIO SURGEON STRL SZ7.5 (GLOVE) IMPLANT
GLOVE BIO SURGEON STRL SZ8 (GLOVE) ×2 IMPLANT
GLOVE BIOGEL PI IND STRL 8 (GLOVE) ×1 IMPLANT
GLOVE BIOGEL PI INDICATOR 8 (GLOVE) ×1
GLOVE SURG SS PI 6.5 STRL IVOR (GLOVE) IMPLANT
GOWN STRL REUS W/TWL LRG LVL3 (GOWN DISPOSABLE) ×4 IMPLANT
GOWN STRL REUS W/TWL XL LVL3 (GOWN DISPOSABLE) ×4 IMPLANT
HANDPIECE INTERPULSE COAX TIP (DISPOSABLE) ×1
IMMOBILIZER KNEE 20 (SOFTGOODS) ×2
IMMOBILIZER KNEE 20 THIGH 36 (SOFTGOODS) ×1 IMPLANT
MANIFOLD NEPTUNE II (INSTRUMENTS) ×2 IMPLANT
NS IRRIG 1000ML POUR BTL (IV SOLUTION) ×2 IMPLANT
PACK TOTAL KNEE CUSTOM (KITS) ×2 IMPLANT
PAD ABD 8X10 STRL (GAUZE/BANDAGES/DRESSINGS) ×2 IMPLANT
PADDING CAST COTTON 6X4 STRL (CAST SUPPLIES) ×2 IMPLANT
POSITIONER SURGICAL ARM (MISCELLANEOUS) ×2 IMPLANT
SET HNDPC FAN SPRY TIP SCT (DISPOSABLE) ×1 IMPLANT
STRIP CLOSURE SKIN 1/2X4 (GAUZE/BANDAGES/DRESSINGS) ×2 IMPLANT
SUT VIC AB 2-0 CT1 27 (SUTURE) ×3
SUT VIC AB 2-0 CT1 TAPERPNT 27 (SUTURE) ×3 IMPLANT
SUT VLOC 180 0 24IN GS25 (SUTURE) ×2 IMPLANT
SWAB COLLECTION DEVICE MRSA (MISCELLANEOUS) IMPLANT
SWAB CULTURE ESWAB REG 1ML (MISCELLANEOUS) IMPLANT
SYR 50ML LL SCALE MARK (SYRINGE) ×4 IMPLANT
TOWER CARTRIDGE SMART MIX (DISPOSABLE) ×2 IMPLANT
TRAY FOLEY W/METER SILVER 16FR (SET/KITS/TRAYS/PACK) ×2 IMPLANT
TUBE KAMVAC SUCTION (TUBING) IMPLANT
WATER STERILE IRR 1500ML POUR (IV SOLUTION) ×2 IMPLANT
WRAP KNEE MAXI GEL POST OP (GAUZE/BANDAGES/DRESSINGS) ×2 IMPLANT

## 2016-06-25 NOTE — Interval H&P Note (Signed)
History and Physical Interval Note:  06/25/2016 9:37 AM  Felicia SolianJennifer L Breshears  has presented today for surgery, with the diagnosis of UNSTABLE LEFT TOTAL KNEE ARTHROPLASTY  The various methods of treatment have been discussed with the patient and family. After consideration of risks, benefits and other options for treatment, the patient has consented to  Procedure(s): LEFT KNEE POLYETHLENE VS TOTAL KNEE ARTHROPLASTY REVISION (Left) as a surgical intervention .  The patient's history has been reviewed, patient examined, no change in status, stable for surgery.  I have reviewed the patient's chart and labs.  Questions were answered to the patient's satisfaction.     Loanne DrillingALUISIO,Dimitria Ketchum V

## 2016-06-25 NOTE — Transfer of Care (Signed)
Immediate Anesthesia Transfer of Care Note  Patient: Felicia Gordon  Procedure(s) Performed: Procedure(s): LEFT KNEE POLYETHLENE EXCHANGE (Left)  Patient Location: PACU  Anesthesia Type:General  Level of Consciousness: awake, alert  and oriented  Airway & Oxygen Therapy: Patient Spontanous Breathing and Patient connected to nasal cannula oxygen  Post-op Assessment: Report given to RN and Post -op Vital signs reviewed and stable  Post vital signs: Reviewed and stable  Last Vitals:  Vitals:   06/25/16 0921 06/25/16 0922  BP:    Pulse: 73 75  Resp: 11 13  Temp:      Last Pain:  Vitals:   06/25/16 0848  TempSrc:   PainSc: 4       Patients Stated Pain Goal: 4 (06/25/16 0848)  Complications: No apparent anesthesia complications

## 2016-06-25 NOTE — Anesthesia Procedure Notes (Signed)
Procedure Name: LMA Insertion Date/Time: 06/25/2016 9:47 AM Performed by: Thornell MuleSTUBBLEFIELD, Litsy Epting G Pre-anesthesia Checklist: Patient identified, Emergency Drugs available, Suction available and Patient being monitored Patient Re-evaluated:Patient Re-evaluated prior to inductionOxygen Delivery Method: Circle system utilized Preoxygenation: Pre-oxygenation with 100% oxygen Intubation Type: IV induction LMA: LMA inserted LMA Size: 4.0 Number of attempts: 1 Placement Confirmation: positive ETCO2 Tube secured with: Tape Dental Injury: Teeth and Oropharynx as per pre-operative assessment

## 2016-06-25 NOTE — Progress Notes (Signed)
Assisted Dr. Rob Fitzgerald with left, ultrasound guided, adductor canal block. Side rails up, monitors on throughout procedure. See vital signs in flow sheet. Tolerated Procedure well.  

## 2016-06-25 NOTE — H&P (View-Only) (Signed)
Felicia Gordon DOB: 06/01/1970 Separated / Language: [place] / Race: White Female Date of Admission:  06/25/2016  CC:  Left knee instability History of Present Illness  The patient is a 46 year old female who comes in for a preoperative History and Physical. The patient is scheduled for a left total knee arthroplasty (revision) to be performed by Dr. Gus RankinFrank V. Aluisio, [facility] 06-25-2016. The patient is a 46 year old female who presented for follow up of their knee. The patient is being followed for their left knee pain and s/p TKA by Dr Everardo BealsBolognesi. Symptoms reported include: pain. The patient feels that they are doing well (feels better when wearing the brace). Current treatment includes: bracing (economy). Felicia DikeJennifer has been seen and followed in the clininc and it was felt as though she had an unstable total knee. We treated her with bracing and she feels it when she is wearing the brace, the knee is more comfortable. It is cumbersome to use the brace and when she does not wear it at night her knee really is unstable when she moves in bed. She does not like to wear the brace at night because it becomes uncomfortable. She is at a stage now where she feels she needs to have something done with the knee to allow her to get back to doing activities that she desires. She is encouraged that the brace helped and encouraged that if we could correct the instability that she will be able to do a lot more and tolerate this better. She is ready to proceed with surgery at this time. They have been treated conservatively in the past for the above stated problem and despite conservative measures, they continue to have progressive pain and severe functional limitations and dysfunction. They have failed non-operative management including home exercise, medications, and bracing. It is felt that they would benefit from undergoing revision of the total joint replacement. Risks and benefits of the  procedure have been discussed with the patient and they elect to proceed with surgery. There are no active contraindications to surgery such as ongoing infection or rapidly progressive neurological disease.  Problem List/Past Medical  Chronic pain of left knee (M25.562)  Chronic Ulcerative Colitis  Anxiety Disorder  Shingles  Depression  Osteopenia  Allergies NSAIDs  GI intolerance  Family History Chronic Obstructive Lung Disease  Maternal Grandfather. Depression  Mother. Heart Disease  Maternal Grandmother. Hypertension  Father, Mother. Osteoarthritis  Mother. Osteoporosis  Mother. Rheumatoid Arthritis  Sister.  Social History Children  1 Current drinker  12/07/2015: Currently drinks wine only occasionally per week Current work status  working full time Exercise  Exercises weekly; does individual sport Living situation  live alone Marital status  divorced No history of drug/alcohol rehab  Not under pain contract  Number of flights of stairs before winded  4-5 Tobacco / smoke exposure  12/07/2015: no Tobacco use  Never smoker. 12/07/2015  Medication History  Benadryl (25MG  Tablet, Oral) Active. TraZODone HCl (50MG  Tablet, Oral) Active. Atacand (8MG  Tablet, Oral) Active. Xanax (1MG  Tablet, Oral) Active. BuPROPion HCl ER (XL) (150MG  Tablet ER 24HR, Oral) Active. FLUoxetine HCl (20MG  Capsule, Oral) Active. Singulair (10MG  Tablet, Oral) Active. Myzilra (Oral) Active. ValACYclovir HCl (500MG  Tablet, Oral) Active. Omeprazole (40MG  Capsule DR, Oral) Active. Hydrocodone-Acetaminophen (5-325MG  Tablet, Oral) Active. Gabapentin (300MG  Capsule, Oral) Active. Montelukast Sodium (10MG  Tablet, Oral) Active.   Past Surgical History Appendectomy  Arthroscopy of Knee  left Arthroscopy of Shoulder  right Gallbladder Surgery  laporoscopic Rotator Cuff Repair  right Total Knee Replacement  left   Review of Systems General Not Present-  Chills, Fatigue, Fever, Memory Loss, Night Sweats, Weight Gain and Weight Loss. Skin Not Present- Eczema, Hives, Itching, Lesions and Rash. HEENT Not Present- Dentures, Double Vision, Headache, Hearing Loss, Tinnitus and Visual Loss. Respiratory Not Present- Allergies, Chronic Cough, Coughing up blood, Shortness of breath at rest and Shortness of breath with exertion. Cardiovascular Not Present- Chest Pain, Difficulty Breathing Lying Down, Murmur, Palpitations, Racing/skipping heartbeats and Swelling. Gastrointestinal Not Present- Abdominal Pain, Bloody Stool, Constipation, Diarrhea, Difficulty Swallowing, Heartburn, Jaundice, Loss of appetitie, Nausea and Vomiting. Female Genitourinary Not Present- Blood in Urine, Discharge, Flank Pain, Incontinence, Painful Urination, Urgency, Urinary frequency, Urinary Retention, Urinating at Night and Weak urinary stream. Musculoskeletal Present- Joint Pain. Not Present- Back Pain, Joint Swelling, Morning Stiffness, Muscle Pain, Muscle Weakness and Spasms. Neurological Not Present- Blackout spells, Difficulty with balance, Dizziness, Paralysis, Tremor and Weakness. Psychiatric Not Present- Insomnia.  Vitals Weight: 155 lb Height: 65in Weight was reported by patient. Height was reported by patient. Body Surface Area: 1.78 m Body Mass Index: 25.79 kg/m  Pulse: 76 (Regular)  BP: 124/86 (Sitting, Right Arm, Standard)  Physical Exam General Mental Status -Alert, cooperative and good historian. General Appearance-pleasant, Not in acute distress. Orientation-Oriented X3. Build & Nutrition-Well nourished and Well developed.  Head and Neck Head-normocephalic, atraumatic . Neck Global Assessment - supple, no bruit auscultated on the right, no bruit auscultated on the left.  Eye Pupil - Bilateral-Regular and Round. Motion - Bilateral-EOMI.  Chest and Lung Exam Auscultation Breath sounds - clear at anterior chest wall and clear  at posterior chest wall. Adventitious sounds - No Adventitious sounds.  Cardiovascular Auscultation Rhythm - Regular rate and rhythm. Heart Sounds - S1 WNL and S2 WNL. Murmurs & Other Heart Sounds - Auscultation of the heart reveals - No Murmurs.  Abdomen Palpation/Percussion Tenderness - Abdomen is non-tender to palpation. Rigidity (guarding) - Abdomen is soft. Auscultation Auscultation of the abdomen reveals - Bowel sounds normal.  Female Genitourinary Note: Not done, not pertinent to present illness   Musculoskeletal Note: On exam, she is alert and oriented, in no apparent distress. Left knee shows about 5 to 10 degrees of hyperextension, flexion down to about 125 or 130. She has a significant amount of varus, valgus and AP laxity on range of motion.  Assessment & Plan  Aftercare following left knee joint replacement surgery (Z47.1, [id number]) Chronic pain of left knee (M25.562, G89.29)  Note:Surgical Plans: Left Polyethylene Revision versus Left Total Knee Revision  Disposition: Home with family. She [facility]. RX provided to the patient to setup and start on Monday, February 26th.  PCP: Dr. Cranford MonNacouzi - Patient has been seen preoperatively and felt to be stable for surgery.  IV TXA  Anesthesia Issues: None  Patient was instructed on what medications to stop prior to surgery.  Signed electronically by Beckey RutterAlezandrew L Galileo Colello, III PA-C

## 2016-06-25 NOTE — Op Note (Signed)
NAMELaneice, Gordon NO.:  [id number]  MEDICAL RECORD NO.:  [id number]  LOCATION:                                 FACILITY:  PHYSICIAN:  Ollen Gross, M.D.         DATE OF BIRTH:  DATE OF PROCEDURE:  06/25/2016 DATE OF DISCHARGE:                              OPERATIVE REPORT   PREOPERATIVE DIAGNOSIS:  Unstable left total knee arthroplasty.  POSTOPERATIVE DIAGNOSIS:  Unstable left total knee arthroplasty.  PROCEDURE:  Left knee polyethylene revision.  SURGEON:  Ollen Gross, MD.  ASSISTANT:  Felicia L. Perkins, PA-C.  ANESTHESIA:  General and adductor canal block.  ESTIMATED BLOOD LOSS:  Minimal.  DRAINS:  Hemovac x1.  TOURNIQUET TIME:  23 minutes at 300 mmHg.  COMPLICATIONS:  None.  CONDITION:  Stable to Recovery.  BRIEF CLINICAL NOTE:  Felicia Gordon is a 46 year old female, had a left total knee arthroplasty done several years ago.  She has had persistent pain and significant instability in the knee.  Bone scan was negative for any periprosthetic loosening.  She has failed bracing.  She presents now for polyethylene versus total knee revision.  PROCEDURE IN DETAIL:  After successful administration of adductor canal block and then general anesthetic, a tourniquet was placed high on her left thigh and her left lower extremity was prepped and draped in the usual sterile fashion.  Extremities wrapped in Esmarch, and tourniquet inflated to 300 mmHg.  A midline incision was made with 10 blade through subcutaneous tissue to the extensor mechanism.  A fresh blade was used to make a medial parapatellar arthrotomy.  We did not encounter fluid in the joint.  She had significant hyperextension as well as significant varus, valgus, and AP laxity.  I was able to sublux the tibia forward on the femur, actually dislocated from the femur.  I removed the tibial polyethylene which was a Zimmer Natural size 1, 2, 13 mm thickness.  The metal components were in  good position.  We then placed the trial 16 mm which was the next size up and we placed that and reduced it.  There was still instability.  The next size up to 19 mm and we placed that.  With the 19, she still achieved full extension, but did not hyperextend.  She had excellent varus, valgus, and anterior-posterior stability throughout full range of motion.  Given that the metal components were well fixed and appropriately aligned, it was felt that this would be a good solution.  Her tibial size was larger than the femoral size for her components based on her anatomy.  There was some overhang of the polyethylene medially on the femur, but not that was impinging.  The knee was placed through a full range of motion again and there was excellent stability with the permanent implant in place.  Wound was then copiously irrigated with saline solution.  A 20 mL of Exparel mixed with 30 mL saline were then injected into the periosteum of the femur, the extensor mechanism, and the subcu tissues.  The arthrotomy was then closed over Hemovac drain with a running #1 V-Loc suture.  Tourniquet released, total time of 23 minutes.  There was minimal bleeding and that stopped with electrocautery.  The subcu was then closed with interrupted 2-0 Vicryl and subcuticular running 4-0 Monocryl.  The incision was cleaned and dried and a Steri-Strips and a bulky sterile dressing applied.  She was then awakened and transported to Recovery in stable condition.  Note that a surgical assistant was a medical necessity for this procedure.  Assistant was necessary for retraction of ligaments and vital neurovascular structures and for proper positioning of the limb to allow for safe removal of the old implant and accurate placement of the new implant.     Ollen GrossFrank Meliton Gordon, M.D.     FA/MEDQ  D:  06/25/2016  T:  06/25/2016  Job:  [id number]

## 2016-06-25 NOTE — Brief Op Note (Signed)
06/25/2016  10:44 AM  PATIENT:  Francella SolianJennifer L Legere  46 y.o. female  PRE-OPERATIVE DIAGNOSIS:  UNSTABLE LEFT TOTAL KNEE ARTHROPLASTY  POST-OPERATIVE DIAGNOSIS: UNSTABLE LEFT TOTAL KNEE ARTHROPLASTY   PROCEDURE:  Procedure(s): LEFT KNEE POLYETHLENE VS TOTAL KNEE ARTHROPLASTY REVISION (Left)  SURGEON:  Surgeon(s) and Role:    * Ollen GrossFrank Sonora Catlin, MD - Primary  PHYSICIAN ASSISTANT:   ASSISTANTS: Avel Peacerew Perkins, PA-C   ANESTHESIA:   Adductor canal block and General  EBL:  No intake/output data recorded.  BLOOD ADMINISTERED:none  DRAINS: (Medium) Hemovact drain(s) in the left knee with  Suction Open   LOCAL MEDICATIONS USED:  OTHER Exparel  COUNTS:  YES  TOURNIQUET:  23 minutes @ 300 mm Hg  DICTATION: .Other Dictation: Dictation Number [id number]  PLAN OF CARE: Admit for overnight observation  PATIENT DISPOSITION:  PACU - hemodynamically stable.

## 2016-06-25 NOTE — Anesthesia Procedure Notes (Signed)
Anesthesia Regional Block: Adductor canal block   Pre-Anesthetic Checklist: ,, timeout performed, Correct Patient, Correct Site, Correct Laterality, Correct Procedure, Correct Position, site marked, Risks and benefits discussed,  Surgical consent,  Pre-op evaluation,  At surgeon's request and post-op pain management  Laterality: Left  Prep: chloraprep       Needles:  Injection technique: Single-shot  Needle Type: Echogenic Needle     Needle Length: 9cm  Needle Gauge: 21     Additional Needles:   Procedures: ultrasound guided,,,,,,,,  Narrative:  Start time: 06/25/2016 9:10 AM End time: 06/25/2016 9:16 AM Injection made incrementally with aspirations every 5 mL.  Performed by: Personally  Anesthesiologist: Marcene DuosFITZGERALD, Allard Lightsey

## 2016-06-25 NOTE — Anesthesia Postprocedure Evaluation (Signed)
Anesthesia Post Note  Patient: Felicia Gordon  Procedure(s) Performed: Procedure(s) (LRB): LEFT KNEE POLYETHLENE EXCHANGE (Left)  Patient location during evaluation: PACU Anesthesia Type: General Level of consciousness: awake and alert Pain management: pain level controlled Vital Signs Assessment: post-procedure vital signs reviewed and stable Respiratory status: spontaneous breathing, nonlabored ventilation, respiratory function stable and patient connected to nasal cannula oxygen Cardiovascular status: blood pressure returned to baseline and stable Postop Assessment: no signs of nausea or vomiting Anesthetic complications: no       Last Vitals:  Vitals:   06/25/16 1306 06/25/16 1331  BP: (!) 89/58 (!) 110/59  Pulse: (!) 107 99  Resp: 14 15  Temp: 37.3 C 37.3 C    Last Pain:  Vitals:   06/25/16 1331  TempSrc: Oral  PainSc:                  Kennieth RadFitzgerald, Ailah Barna E

## 2016-06-25 NOTE — Evaluation (Signed)
Physical Therapy Evaluation Patient Details Name: Felicia Gordon MRN: [id number] DOB: 11-Dec-1970 Today's Date: 06/25/2016   History of Present Illness  L TKA poly echange, h/o 6 prior L knee surgeries including TKA 2 years ago @ [facility]  Clinical Impression  Pt is s/p TKA resulting in the deficits listed below (see PT Problem List). Pt ambulated 20' with RW with min/guard assist. Performed TKA exercises with min A. Good progress expected.  Pt will benefit from skilled PT to increase their independence and safety with mobility to allow discharge to the venue listed below.      Follow Up Recommendations Outpatient PT    Equipment Recommendations  None recommended by PT    Recommendations for Other Services       Precautions / Restrictions Precautions Precautions: Knee Restrictions Weight Bearing Restrictions: No Other Position/Activity Restrictions: wbat      Mobility  Bed Mobility Overal bed mobility: Modified Independent             General bed mobility comments: with rail  Transfers Overall transfer level: Needs assistance Equipment used: Rolling walker (2 wheeled) Transfers: Sit to/from Stand Sit to Stand: Min assist         General transfer comment: VCs hand placement, min A to rise  Ambulation/Gait Ambulation/Gait assistance: Min guard Ambulation Distance (Feet): 20 Feet Assistive device: Rolling walker (2 wheeled) Gait Pattern/deviations: Step-to pattern;Decreased step length - right;Decreased step length - left   Gait velocity interpretation: Below normal speed for age/gender General Gait Details: VCs hand placement  Stairs            Wheelchair Mobility    Modified Rankin (Stroke Patients Only)       Balance Overall balance assessment: Modified Independent                                           Pertinent Vitals/Pain Pain Assessment: 0-10 Pain Score: 5  Pain Location: L knee Pain Descriptors / Indicators:  Sore Pain Intervention(s): Limited activity within patient's tolerance;Monitored during session;Premedicated before session;Ice applied    Home Living Family/patient expects to be discharged to:: Private residence Living Arrangements: Alone Available Help at Discharge: Family   Home Access: Stairs to enter Entrance Stairs-Rails: None Entrance Stairs-Number of Steps: 3 Home Layout: One level Home Equipment: Shower seat - built in;Crutches;Other (comment);Walker - 2 wheels (polar ice)      Prior Function Level of Independence: Independent         Comments: walked with knee brace for long distances     Hand Dominance        Extremity/Trunk Assessment   Upper Extremity Assessment Upper Extremity Assessment: Overall WFL for tasks assessed    Lower Extremity Assessment Lower Extremity Assessment: LLE deficits/detail LLE Deficits / Details: SLR 3/5, knee 5-40* AAROM    Cervical / Trunk Assessment Cervical / Trunk Assessment: Normal  Communication   Communication: No difficulties  Cognition Arousal/Alertness: Awake/alert Behavior During Therapy: WFL for tasks assessed/performed Overall Cognitive Status: Within Functional Limits for tasks assessed                      General Comments      Exercises Total Joint Exercises Ankle Circles/Pumps: AROM;Both;10 reps Quad Sets: AROM;Left;5 reps;Supine Long Arc Quad: AROM;Left;5 reps Knee Flexion: AAROM;Left;5 reps;Seated   Assessment/Plan    PT Assessment Patient needs continued PT services  PT Problem List Decreased strength;Decreased range of motion;Decreased activity tolerance;Pain;Decreased balance;Decreased knowledge of use of DME;Decreased mobility       PT Treatment Interventions DME instruction;Gait training;Stair training;Functional mobility training;Therapeutic exercise;Therapeutic activities;Patient/family education    PT Goals (Current goals can be found in the Care Plan section)  Acute Rehab PT  Goals Patient Stated Goal: 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 [employer] PT Goal Formulation: With patient Time For Goal Achievement: 07/02/16 Potential to Achieve Goals: Good    Frequency 7X/week   Barriers to discharge        Co-evaluation               End of Session Equipment Utilized During Treatment: Gait belt Activity Tolerance: Patient tolerated treatment well Patient left: in chair;with call bell/phone within reach Nurse Communication: Mobility status PT Visit Diagnosis: Difficulty in walking, not elsewhere classified (R26.2);Pain Pain - Right/Left: Left Pain - part of body: Knee    Functional Assessment Tool Used: Clinical judgement Functional Limitation: Mobility: Walking and moving around Mobility: Walking and Moving Around Current Status ([id number](G8978): At least 20 percent but less than 40 percent impaired, limited or restricted Mobility: Walking and Moving Around Goal Status [phone](G8979): At least 1 percent but less than 20 percent impaired, limited or restricted    Time: [id number] PT Time Calculation (min) (ACUTE ONLY): 35 min   Charges:   PT Evaluation $PT Eval Low Complexity: 1 Procedure PT Treatments $Gait Training: 8-22 mins   PT G Codes:   PT G-Codes **NOT FOR INPATIENT CLASS** Functional Assessment Tool Used: Clinical judgement Functional Limitation: Mobility: Walking and moving around Mobility: Walking and Moving Around Current Status ([id number](G8978): At least 20 percent but less than 40 percent impaired, limited or restricted Mobility: Walking and Moving Around Goal Status [phone](G8979): At least 1 percent but less than 20 percent impaired, limited or restricted     Tamala SerUhlenberg, Blakley Kistler 06/25/2016, 3:34 PM [phone]

## 2016-06-26 ENCOUNTER — Encounter (HOSPITAL_COMMUNITY): Payer: Self-pay | Admitting: Orthopedic Surgery

## 2016-06-26 DIAGNOSIS — F419 Anxiety disorder, unspecified: Secondary | ICD-10-CM | POA: Diagnosis present

## 2016-06-26 DIAGNOSIS — M25362 Other instability, left knee: Secondary | ICD-10-CM | POA: Diagnosis present

## 2016-06-26 DIAGNOSIS — Z79899 Other long term (current) drug therapy: Secondary | ICD-10-CM | POA: Diagnosis not present

## 2016-06-26 DIAGNOSIS — T84023A Instability of internal left knee prosthesis, initial encounter: Secondary | ICD-10-CM | POA: Diagnosis present

## 2016-06-26 DIAGNOSIS — M25562 Pain in left knee: Secondary | ICD-10-CM | POA: Diagnosis present

## 2016-06-26 DIAGNOSIS — Y831 Surgical operation with implant of artificial internal device as the cause of abnormal reaction of the patient, or of later complication, without mention of misadventure at the time of the procedure: Secondary | ICD-10-CM | POA: Diagnosis present

## 2016-06-26 DIAGNOSIS — F329 Major depressive disorder, single episode, unspecified: Secondary | ICD-10-CM | POA: Diagnosis present

## 2016-06-26 LAB — BASIC METABOLIC PANEL
ANION GAP: 4 — AB (ref 5–15)
CHLORIDE: 109 mmol/L (ref 101–111)
CO2: 30 mmol/L (ref 22–32)
Calcium: 8.6 mg/dL — ABNORMAL LOW (ref 8.9–10.3)
Creatinine, Ser: 0.62 mg/dL (ref 0.44–1.00)
GFR calc Af Amer: >60 mL/min (ref >60)
GLUCOSE: 132 mg/dL — AB (ref 65–99)
POTASSIUM: 4.3 mmol/L (ref 3.5–5.1)
Sodium: 143 mmol/L (ref 135–145)

## 2016-06-26 LAB — CBC
HCT: 35 % — ABNORMAL LOW (ref 36.0–46.0)
HEMOGLOBIN: 11.7 g/dL — AB (ref 12.0–15.0)
MCH: 30.7 pg (ref 26.0–34.0)
MCHC: 33.4 g/dL (ref 30.0–36.0)
MCV: 91.9 fL (ref 78.0–100.0)
PLATELETS: 319 10*3/uL (ref 150–400)
RBC: 3.81 MIL/uL — AB (ref 3.87–5.11)
RDW: 12.6 % (ref 11.5–15.5)
WBC: 12.8 10*3/uL — AB (ref 4.0–10.5)

## 2016-06-26 MED ORDER — OXYCODONE HCL 5 MG PO TABS
5.0000 mg | ORAL_TABLET | ORAL | Status: DC | PRN
  Administered 2016-06-26 (×4): 15 mg via ORAL
  Administered 2016-06-26: 10 mg via ORAL
  Administered 2016-06-27: 15 mg via ORAL
  Filled 2016-06-26 (×6): qty 3

## 2016-06-26 MED ORDER — OMEPRAZOLE 20 MG PO CPDR
40.0000 mg | DELAYED_RELEASE_CAPSULE | Freq: Every day | ORAL | Status: DC
  Administered 2016-06-27: 40 mg via ORAL
  Filled 2016-06-26 (×2): qty 2

## 2016-06-26 MED ORDER — NON FORMULARY: 40.0000 mg | Freq: Every day | Status: DC

## 2016-06-26 NOTE — Progress Notes (Addendum)
   Subjective: 1 Day Post-Op Procedure(s) (LRB): LEFT KNEE POLYETHLENE EXCHANGE (Left) Patient reports pain as moderate and severe.   Patient seen in rounds by Dr. Lequita HaltAluisio.  She has been requiring IV pain management.  Work on pain control today and hopefully home tomorrow. Patient is having problems with pain in the knee, requiring pain medications We will start therapy today.  Plan is to go Home after hospital stay.  Objective: Vital signs in last 24 hours: Temp:  [98.4 F (36.9 C)-99.2 F (37.3 C)] 98.8 F (37.1 C) (02/22 0529) Pulse Rate:  [73-100] 84 (02/22 0529) Resp:  [10-22] 16 (02/22 0529) BP: (106-156)/(57-99) 106/59 (02/22 0529) SpO2:  [93 %-100 %] 93 % (02/22 0529)  Intake/Output from previous day:  Intake/Output Summary (Last 24 hours) at 06/26/16 0756 Last data filed at 06/26/16 0600  Gross per 24 hour  Intake          3718.67 ml  Output             2930 ml  Net           788.67 ml    Intake/Output this shift: No intake/output data recorded.  Labs:  Recent Labs  06/26/16 0423  HGB 11.7*    Recent Labs  06/26/16 0423  WBC 12.8*  RBC 3.81*  HCT 35.0*  PLT 319    Recent Labs  06/26/16 0423  NA 143  K 4.3  CL 109  CO2 30  BUN <5*  CREATININE 0.62  GLUCOSE 132*  CALCIUM 8.6*   No results for input(s): LABPT, INR in the last 72 hours.  EXAM General - Patient is Alert, Appropriate and Oriented Extremity - Neurovascular intact Sensation intact distally Intact pulses distally Dorsiflexion/Plantar flexion intact Dressing - dressing C/D/I Motor Function - intact, moving foot and toes well on exam.  Hemovac pulled without difficulty.  Past Medical History:  Diagnosis Date  . Anxiety   . Depression   . PONV (postoperative nausea and vomiting)   . Stomach ulcer    hx of    Assessment/Plan: 1 Day Post-Op Procedure(s) (LRB): LEFT KNEE POLYETHLENE EXCHANGE (Left) Principal Problem:   Failed total knee arthroplasty, sequela Active  Problems:   Failed total knee arthroplasty (HCC)  Estimated body mass index is 24.63 kg/m as calculated from the following:   Height as of this encounter: 5\' 5"  (1.651 m).   Weight as of this encounter: 67.1 kg (148 lb). Advance diet Up with therapy Plan for discharge tomorrow  She [facility]. RX provided to the patient to setup and start on Monday, February 26th.  DVT Prophylaxis - Xarelto Weight-Bearing as tolerated to left leg D/C O2 and Pulse OX and try on Room Air  Avel Peacerew Quinetta Shilling, PA-C Orthopaedic Surgery 06/26/2016, 7:56 AM

## 2016-06-26 NOTE — Care Management Note (Signed)
Case Management Note  Patient Details  Name: Felicia Gordon MRN: [id number] Date of Birth: 09-28-1970  Subjective/Objective:                  LEFT KNEE POLYETHLENE EXCHANGE (Left) Action/Plan: Discharge planning Expected Discharge Date:                  Expected Discharge Plan:  Home/Self Care  In-House Referral:     Discharge planning Services  CM Consult  Post Acute Care Choice:  NA Choice offered to:  Patient  DME Arranged:  N/A DME Agency:  NA  HH Arranged:  NA HH Agency:  NA  Status of Service:  Completed, signed off  If discussed at [place] of Stay Meetings, dates discussed:    Additional Comments: CM met with pt to confirm is for outpt PT; pt confirms. Pt has all DMe needed at home. No other CM needs were communicated. Dellie Catholic, RN 06/26/2016, 1:51 PM

## 2016-06-26 NOTE — Progress Notes (Signed)
Physical Therapy Treatment Patient Details Name: Felicia Gordon MRN: [id number] DOB: 1970/12/22 Today's Date: 06/26/2016    History of Present Illness L TKA poly echange, h/o 6 prior L knee surgeries including TKA 2 years ago @ [facility]    PT Comments    Pt very emotional and crying in pain.  Applied Ki and instructed on use.  Pt unable to say whether or not wearing the KI helped support knee or if helped decreased pain.  Assisted OOB to amb a great distance in hallway.  Practiced stair.  Pt prefers to amb with her crutches as she has been doping prior.  Returned bed per pt request and applied ICE.     Follow Up Recommendations  Outpatient PT     Equipment Recommendations  None recommended by PT    Recommendations for Other Services       Precautions / Restrictions Precautions Precautions: Knee Precaution Comments: Instructed on Ki use and proper application Restrictions Weight Bearing Restrictions: No Other Position/Activity Restrictions: WBAT    Mobility  Bed Mobility Overal bed mobility: Needs Assistance Bed Mobility: Supine to Sit;Sit to Supine     Supine to sit: Supervision;Min guard Sit to supine: Supervision;Min guard   General bed mobility comments: L LE and increased time  Transfers Overall transfer level: Needs assistance Equipment used: Rolling walker (2 wheeled);Crutches Transfers: Sit to/from [employer] Sit to Stand: Min guard;Supervision Stand pivot transfers: Min guard;Supervision       General transfer comment: pt prefers to amb with B crutches at [employer] for safety.  Pt performing at TTWB thru L LE even though she is allowed full.   Ambulation/Gait Ambulation/Gait assistance: Min guard Ambulation Distance (Feet): 62 Feet Assistive device: Crutches Gait Pattern/deviations: Step-to pattern;Step-through pattern;Decreased stance time - left Gait velocity: decreased   General Gait Details: pt prefers to amb with B  crutches at [employer] for safety.  Pt performing at TTWB thru L LE even though she is allowed full.    Stairs Stairs: Yes   Stair Management: No rails;Step to pattern;Forwards;With crutches Number of Stairs: 2 General stair comments: only one initial VC needed for safety.  Pt performed well.   Wheelchair Mobility    Modified Rankin (Stroke Patients Only)       Balance                                    Cognition Arousal/Alertness:  (emotional) Behavior During Therapy: WFL for tasks assessed/performed Overall Cognitive Status: Within Functional Limits for tasks assessed                 General Comments: crying in pain    Exercises      General Comments        Pertinent Vitals/Pain Pain Assessment: 0-10 Pain Score: 8  Pain Location: L knee Pain Descriptors / Indicators: Sore;Crushing;Grimacing;Operative site guarding;Tender Pain Intervention(s): Monitored during session;Repositioned;Ice applied    Home Living                      Prior Function            PT Goals (current goals can now be found in the care plan section) Progress towards PT goals: Progressing toward goals    Frequency    7X/week      PT Plan Current plan remains appropriate    Co-evaluation  End of Session Equipment Utilized During Treatment: Left knee immobilizer;Gait belt Activity Tolerance: Patient limited by pain;Other (comment) (emotional/crying) Patient left: in bed;with call bell/phone within reach   PT Visit Diagnosis: Difficulty in walking, not elsewhere classified (R26.2);Pain Pain - Right/Left: Left Pain - part of body: Knee     Time: [id number] PT Time Calculation (min) (ACUTE ONLY): 13 min  Charges:  $Gait Training: 8-22 mins                    G Codes:       Felicia ShellingLori Maykel Gordon  PTA [facility]  Acute  Rehab Pager      [phone]

## 2016-06-26 NOTE — Progress Notes (Signed)
OT Cancellation Note  Patient Details Name: Francella SolianJennifer L Kirstein MRN: [id number] DOB: 01/04/1971   Cancelled Treatment:    Reason Eval/Treat Not Completed: OT screened, no needs identified, will sign off  Lise AuerLori Glennys Schorsch, [place] [phone]  Einar CrowEDDING, Maki Hege D 06/26/2016, 10:53 AM

## 2016-06-26 NOTE — Discharge Instructions (Addendum)
° °Dr. Frank Aluisio °Total Joint Specialist °[facility] °[address] °[place], [place] [address] °[phone] ° °TOTAL KNEE REPLACEMENT POSTOPERATIVE DIRECTIONS ° °Knee Rehabilitation, Guidelines Following Surgery  °Results after knee surgery are often greatly improved when you follow the exercise, range of motion and muscle strengthening exercises prescribed by your doctor. Safety measures are also important to protect the knee from further injury. Any time any of these exercises cause you to have increased pain or swelling in your knee joint, decrease the amount until you are comfortable again and slowly increase them. If you have problems or questions, call your caregiver or physical therapist for advice.  ° °HOME CARE INSTRUCTIONS  °Remove items at home which could result in a fall. This includes throw rugs or furniture in walking pathways.  °· ICE to the affected knee every three hours for 30 minutes at a time and then as needed for pain and swelling.  Continue to use ice on the knee for pain and swelling from surgery. You may notice swelling that will progress down to the foot and ankle.  This is normal after surgery.  Elevate the leg when you are not up walking on it.   °· Continue to use the breathing machine which will help keep your temperature down.  It is common for your temperature to cycle up and down following surgery, especially at night when you are not up moving around and exerting yourself.  The breathing machine keeps your lungs expanded and your temperature down. °· Do not place pillow under knee, focus on keeping the knee straight while resting ° °DIET °You may resume your previous home diet once your are discharged from the hospital. ° °DRESSING / WOUND CARE / SHOWERING °You may shower 3 days after surgery, but keep the wounds dry during showering.  You may use an occlusive plastic wrap (Press'n Seal for example), NO SOAKING/SUBMERGING IN THE BATHTUB.  If the  bandage gets wet, change with a clean dry gauze.  If the incision gets wet, pat the wound dry with a clean towel. °You may start showering once you are discharged home but do not submerge the incision under water. Just pat the incision dry and apply a dry gauze dressing on daily. °Change the surgical dressing daily and reapply a dry dressing each time. ° °ACTIVITY °Walk with your walker as instructed. °Use walker as long as suggested by your caregivers. °Avoid periods of inactivity such as sitting longer than an hour when not asleep. This helps prevent blood clots.  °You may resume a sexual relationship in one month or when given the OK by your doctor.  °You may return to work once you are cleared by your doctor.  °Do not drive a car for 6 weeks or until released by you surgeon.  °Do not drive while taking narcotics. ° °WEIGHT BEARING °Weight bearing as tolerated with assist device (walker, cane, etc) as directed, use it as long as suggested by your surgeon or therapist, typically at least 4-6 weeks. ° °POSTOPERATIVE CONSTIPATION PROTOCOL °Constipation - defined medically as fewer than three stools per week and severe constipation as less than one stool per week. ° °One of the most common issues patients have following surgery is constipation.  Even if you have a regular bowel pattern at home, your normal regimen is likely to be disrupted due to multiple reasons following surgery.  Combination of anesthesia, postoperative narcotics, change in appetite and fluid intake all can affect your bowels.    In order to avoid complications following surgery, here are some recommendations in order to help you during your recovery period. ° °Colace (docusate) - Pick up an over-the-counter form of Colace or another stool softener and take twice a day as long as you are requiring postoperative pain medications.  Take with a full glass of water daily.  If you experience loose stools or diarrhea, hold the colace until you stool forms  back up.  If your symptoms do not get better within 1 week or if they get worse, check with your doctor. ° °Dulcolax (bisacodyl) - Pick up over-the-counter and take as directed by the product packaging as needed to assist with the movement of your bowels.  Take with a full glass of water.  Use this product as needed if not relieved by Colace only.  ° °MiraLax (polyethylene glycol) - Pick up over-the-counter to have on hand.  MiraLax is a solution that will increase the amount of water in your bowels to assist with bowel movements.  Take as directed and can mix with a glass of water, juice, soda, coffee, or tea.  Take if you go more than two days without a movement. °Do not use MiraLax more than once per day. Call your doctor if you are still constipated or irregular after using this medication for 7 days in a row. ° °If you continue to have problems with postoperative constipation, please contact the office for further assistance and recommendations.  If you experience "the worst abdominal pain ever" or develop nausea or vomiting, please contact the office immediatly for further recommendations for treatment. ° °ITCHING ° If you experience itching with your medications, try taking only a single pain pill, or even half a pain pill at a time.  You can also use Benadryl over the counter for itching or also to help with sleep.  ° °TED HOSE STOCKINGS °Wear the elastic stockings on both legs for three weeks following surgery during the day but you may remove then at night for sleeping. ° °MEDICATIONS °See your medication summary on the “After Visit Summary” that the nursing staff will review with you prior to discharge.  You may have some home medications which will be placed on hold until you complete the course of blood thinner medication.  It is important for you to complete the blood thinner medication as prescribed by your surgeon.  Continue your approved medications as instructed at time of  discharge. ° °PRECAUTIONS °If you experience chest pain or shortness of breath - call 911 immediately for transfer to the hospital emergency department.  °If you develop a fever greater that 101 F, purulent drainage from wound, increased redness or drainage from wound, foul odor from the wound/dressing, or calf pain - CONTACT YOUR SURGEON.   °                                                °FOLLOW-UP APPOINTMENTS °Make sure you keep all of your appointments after your operation with your surgeon and caregivers. You should call the office at the above phone number and make an appointment for approximately two weeks after the date of your surgery or on the date instructed by your surgeon outlined in the "After Visit Summary". ° ° °RANGE OF MOTION AND STRENGTHENING EXERCISES  °Rehabilitation of the knee is important following a knee injury or   an operation. After just a few days of immobilization, the muscles of the thigh which control the knee become weakened and shrink (atrophy). Knee exercises are designed to build up the tone and strength of the thigh muscles and to improve knee motion. Often times heat used for twenty to thirty minutes before working out will loosen up your tissues and help with improving the range of motion but do not use heat for the first two weeks following surgery. These exercises can be done on a training (exercise) mat, on the floor, on a table or on a bed. Use what ever works the best and is most comfortable for you Knee exercises include:  °Leg Lifts - While your knee is still immobilized in a splint or cast, you can do straight leg raises. Lift the leg to 60 degrees, hold for 3 sec, and slowly lower the leg. Repeat 10-20 times 2-3 times daily. Perform this exercise against resistance later as your knee gets better.  °Quad and Hamstring Sets - Tighten up the muscle on the front of the thigh (Quad) and hold for 5-10 sec. Repeat this 10-20 times hourly. Hamstring sets are done by pushing the  foot backward against an object and holding for 5-10 sec. Repeat as with quad sets.  °· Leg Slides: Lying on your back, slowly slide your foot toward your buttocks, bending your knee up off the floor (only go as far as is comfortable). Then slowly slide your foot back down until your leg is flat on the floor again. °· Angel Wings: Lying on your back spread your legs to the side as far apart as you can without causing discomfort.  °A rehabilitation program following serious knee injuries can speed recovery and prevent re-injury in the future due to weakened muscles. Contact your doctor or a physical therapist for more information on knee rehabilitation.  ° °IF YOU ARE TRANSFERRED TO A SKILLED REHAB FACILITY °If the patient is transferred to a skilled rehab facility following release from the hospital, a list of the current medications will be sent to the facility for the patient to continue.  When discharged from the skilled rehab facility, please have the facility set up the patient's Home Health Physical Therapy prior to being released. Also, the skilled facility will be responsible for providing the patient with their medications at time of release from the facility to include their pain medication, the muscle relaxants, and their blood thinner medication. If the patient is still at the rehab facility at time of the two week follow up appointment, the skilled rehab facility will also need to assist the patient in arranging follow up appointment in our office and any transportation needs. ° °MAKE SURE YOU:  °Understand these instructions.  °Get help right away if you are not doing well or get worse.  ° ° °Pick up stool softner and laxative for home use following surgery while on pain medications. °Do not submerge incision under water. °Please use good hand washing techniques while changing dressing each day. °May shower starting three days after surgery. °Please use a clean towel to pat the incision dry following  showers. °Continue to use ice for pain and swelling after surgery. °Do not use any lotions or creams on the incision until instructed by your surgeon. ° °Take Xarelto for two and a half more weeks following discharge from the hospital, then discontinue Xarelto. °Once the patient has completed the blood thinner regimen, then take a Baby 81 mg Aspirin daily for three   more weeks. ° ° ° °Information on my medicine - XARELTO® (Rivaroxaban) ° °This medication education was reviewed with me or my healthcare representative as part of my discharge preparation.  The pharmacist that spoke with me during my hospital stay was:  Legge, Justin Marshall, RPH ° °Why was Xarelto® prescribed for you? °Xarelto® was prescribed for you to reduce the risk of blood clots forming after orthopedic surgery. The medical term for these abnormal blood clots is venous thromboembolism (VTE). ° °What do you need to know about xarelto® ? °Take your Xarelto® ONCE DAILY at the same time every day. °You may take it either with or without food. ° °If you have difficulty swallowing the tablet whole, you may crush it and mix in applesauce just prior to taking your dose. ° °Take Xarelto® exactly as prescribed by your doctor and DO NOT stop taking Xarelto® without talking to the doctor who prescribed the medication.  Stopping without other VTE prevention medication to take the place of Xarelto® may increase your risk of developing a clot. ° °After discharge, you should have regular check-up appointments with your healthcare provider that is prescribing your Xarelto®.   ° °What do you do if you miss a dose? °If you miss a dose, take it as soon as you remember on the same day then continue your regularly scheduled once daily regimen the next day. Do not take two doses of Xarelto® on the same day.  ° °Important Safety Information °A possible side effect of Xarelto® is bleeding. You should call your healthcare provider right away if you experience any of the  following: °? Bleeding from an injury or your nose that does not stop. °? Unusual colored urine (red or dark brown) or unusual colored stools (red or black). °? Unusual bruising for unknown reasons. °? A serious fall or if you hit your head (even if there is no bleeding). ° °Some medicines may interact with Xarelto® and might increase your risk of bleeding while on Xarelto®. To help avoid this, consult your healthcare provider or pharmacist prior to using any new prescription or non-prescription medications, including herbals, vitamins, non-steroidal anti-inflammatory drugs (NSAIDs) and supplements. ° °This website has more information on Xarelto®: [email or web address].com. ° ° ° °

## 2016-06-27 LAB — BASIC METABOLIC PANEL
Anion gap: 6 (ref 5–15)
BUN: 5 mg/dL — ABNORMAL LOW (ref 6–20)
CALCIUM: 8.4 mg/dL — AB (ref 8.9–10.3)
CHLORIDE: 108 mmol/L (ref 101–111)
CO2: 29 mmol/L (ref 22–32)
CREATININE: 0.46 mg/dL (ref 0.44–1.00)
GFR calc Af Amer: >60 mL/min (ref >60)
GFR calc non Af Amer: >60 mL/min (ref >60)
GLUCOSE: 105 mg/dL — AB (ref 65–99)
Potassium: 4.1 mmol/L (ref 3.5–5.1)
Sodium: 143 mmol/L (ref 135–145)

## 2016-06-27 LAB — CBC
HEMATOCRIT: 34.5 % — AB (ref 36.0–46.0)
Hemoglobin: 11.4 g/dL — ABNORMAL LOW (ref 12.0–15.0)
MCH: 30.6 pg (ref 26.0–34.0)
MCHC: 33 g/dL (ref 30.0–36.0)
MCV: 92.7 fL (ref 78.0–100.0)
Platelets: 302 10*3/uL (ref 150–400)
RBC: 3.72 MIL/uL — ABNORMAL LOW (ref 3.87–5.11)
RDW: 12.8 % (ref 11.5–15.5)
WBC: 11.5 10*3/uL — ABNORMAL HIGH (ref 4.0–10.5)

## 2016-06-27 MED ORDER — HYDROMORPHONE HCL 2 MG PO TABS
4.0000 mg | ORAL_TABLET | ORAL | Status: DC | PRN
  Administered 2016-06-27 (×2): 4 mg via ORAL
  Administered 2016-06-27 (×2): 8 mg via ORAL
  Filled 2016-06-27: qty 2
  Filled 2016-06-27 (×2): qty 4
  Filled 2016-06-27: qty 2

## 2016-06-27 MED ORDER — METHOCARBAMOL 500 MG PO TABS: 500.0000 mg | ORAL_TABLET | Freq: Four times a day (QID) | ORAL | 0 refills | Status: DC | PRN

## 2016-06-27 MED ORDER — HYDROMORPHONE HCL 4 MG PO TABS
4.0000 mg | ORAL_TABLET | ORAL | 0 refills | Status: DC | PRN
Start: 2016-06-27 — End: 2018-03-20

## 2016-06-27 MED ORDER — RIVAROXABAN 10 MG PO TABS: 10.0000 mg | ORAL_TABLET | Freq: Every day | ORAL | 0 refills | Status: DC

## 2016-06-27 MED ORDER — GABAPENTIN 300 MG PO CAPS: 300.0000 mg | ORAL_CAPSULE | Freq: Two times a day (BID) | ORAL | 0 refills | Status: AC

## 2016-06-27 NOTE — Progress Notes (Signed)
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  Pt did very well with min 5/10 pain.  In good spirits.  Performed one knee flexion TE only 5 reps sitting EOB when pt began to demonstrate increased signs of pain, emotional and crying.  TE ceased.  Assisted back to bed and applied ICE.  Pain increased to 10/10, pt requesting IV pain meds.  Emotional, crying.  Reported to RN.  Follow Up Recommendations  Outpatient PT     Equipment Recommendations  None recommended by PT    Recommendations for Other Services       Precautions / Restrictions Precautions Precautions: Knee Precaution Comments: did not utilize KI today Restrictions Weight Bearing Restrictions: No Other Position/Activity Restrictions: WBAT    Mobility  Bed Mobility Overal bed mobility: Modified Independent             General bed mobility comments: able to self assist OOB with increased time  Transfers Overall transfer level: Needs assistance Equipment used: Crutches Transfers: Sit to/from Stand Sit to Stand: Supervision Stand pivot transfers: Supervision       General transfer comment: good safety tech and use of hands to steady self  Ambulation/Gait Ambulation/Gait assistance: Supervision;Min guard Ambulation Distance (Feet): 95 Feet Assistive device: Crutches Gait Pattern/deviations: Step-through pattern Gait velocity: decreased   General Gait Details: 25% VC's to increase WBing thru L LE as well as heel strike and toe off.    Stairs Stairs: Yes     Number of Stairs: 2 General stair comments: only one initial VC needed for safety.  Pt performed well.   Wheelchair Mobility    Modified  Rankin (Stroke Patients Only)       Balance                                    Cognition Arousal/Alertness: Awake/alert Behavior During Therapy: WFL for tasks assessed/performed Overall Cognitive Status: Within Functional Limits for tasks assessed                      Exercises   Sitting EOB 5 reps of knee flex   General Comments        Pertinent Vitals/Pain Pain Assessment: No/denies pain Pain Score: 4  Pain Location: L knee Pain Descriptors / Indicators: Operative site guarding Pain Intervention(s): Monitored during session;Repositioned;Ice applied    Home Living                      Prior Function            PT Goals (current goals can now be found in the care plan section) Progress towards PT goals: Progressing toward goals    Frequency    7X/week      PT Plan Current plan remains appropriate    Co-evaluation             End of Session Equipment Utilized During Treatment: Gait belt Activity Tolerance: Patient tolerated treatment well Patient left: in bed;with call bell/phone within reach Nurse Communication: Mobility status PT Visit Diagnosis: Difficulty in walking, not  elsewhere classified (R26.2);Pain Pain - Right/Left: Left Pain - part of body: Knee     Time: 1405-1430 PT Time Calculation (min) (ACUTE ONLY): 25 min  Charges:  $Gait Training: 8-22 mins $Therapeutic Exercise: 8-22 mins                    G Codes:       Felecia ShellingLori Brace Welte  PTA [facility]  Acute  Rehab Pager      [phone]

## 2016-06-27 NOTE — Progress Notes (Signed)
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  Pt reports only 4/4 pain.  Assisted OOB to bathroom then amb in hallway.  Performed some TE's followed by ICE.  Will see her one more time before D/C to home.    Follow Up Recommendations  Outpatient PT     Equipment Recommendations  None recommended by PT    Recommendations for Other Services       Precautions / Restrictions Precautions Precautions: Knee Precaution Comments: did not utilize KI today Restrictions Weight Bearing Restrictions: No Other Position/Activity Restrictions: WBAT    Mobility  Bed Mobility Overal bed mobility: Modified Independent             General bed mobility comments: able to self assist OOB with increased time  Transfers Overall transfer level: Needs assistance Equipment used: Crutches Transfers: Sit to/from Stand Sit to Stand: Supervision Stand pivot transfers: Supervision       General transfer comment: good safety tech and use of hands to steady self  Ambulation/Gait Ambulation/Gait assistance: Supervision;Min guard Ambulation Distance (Feet): 95 Feet Assistive device: Crutches Gait Pattern/deviations: Step-through pattern Gait velocity: decreased   General Gait Details: 25% VC's to increase WBing thru L LE as well as heel strike and toe off.    Stairs            Wheelchair Mobility    Modified Rankin (Stroke Patients Only)       Balance                                    Cognition Arousal/Alertness: Awake/alert Behavior During Therapy: WFL for tasks assessed/performed Overall Cognitive Status: Within Functional Limits for tasks assessed                       Exercises    Knee  TE's 10 reps B LE ankle pumps 10 reps towel squeezes 10 reps knee presses 10 reps heel slides (using a long bed sheet) 10 reps SAQ's (unable to complete due to increased pain) 10 reps SLR's AAROM 10 reps ABD (using a long bed sheet) Followed by ICE     General Comments        Pertinent Vitals/Pain Pain Assessment: No/denies pain Pain Score: 4  Pain Location: L knee Pain Descriptors / Indicators: Operative site guarding Pain Intervention(s): Monitored during session;Repositioned;Ice applied    Home Living                      Prior Function            PT Goals (current goals can now be found in the care plan section) Progress towards PT goals: Progressing toward goals    Frequency    7X/week      PT Plan Current plan remains appropriate    Co-evaluation             End of Session Equipment Utilized During Treatment: Gait belt Activity Tolerance: Patient tolerated treatment well Patient left: in bed;with call bell/phone within reach Nurse Communication: Mobility status PT Visit Diagnosis: Difficulty in walking, not  elsewhere classified (R26.2);Pain Pain - Right/Left: Left Pain - part of body: Knee     Time: 1610-9604 PT Time Calculation (min) (ACUTE ONLY): 27 min  Charges:  $Gait Training: 8-22 mins $Therapeutic Exercise: 8-22 mins                    G Codes:       Felecia Gordon  PTA [facility]  Acute  Rehab Pager      [phone]

## 2016-06-27 NOTE — Discharge Summary (Signed)
Physician Discharge Summary   Patient ID: Felicia Gordon MRN: [id number] DOB/AGE: [birth year] 46 y.o.  Admit date: 06/25/2016 Discharge date: 06/27/2016   Primary Diagnosis:  Unstable left total knee arthroplasty.   Admission Diagnoses:  Past Medical History:  Diagnosis Date  . Anxiety   . Depression   . PONV (postoperative nausea and vomiting)   . Stomach ulcer    hx of   Discharge Diagnoses:   Principal Problem:   Failed total knee arthroplasty, sequela Active Problems:   Failed total knee arthroplasty (HCC)   Unstable knee, left  Estimated body mass index is 24.63 kg/m as calculated from the following:   Height as of this encounter: 5' 5"  (1.651 m).   Weight as of this encounter: 67.1 kg (148 lb).  Procedure:  Procedure(s) (LRB): LEFT KNEE POLYETHLENE EXCHANGE (Left)   Consults: None  HPI: Felicia Gordon is a 46 year old female, had a left total knee arthroplasty done several years ago.  She has had persistent pain and significant instability in the knee.  Bone scan was negative for any periprosthetic loosening.  She has failed bracing.  She presents now for polyethylene versus total knee revision.  Laboratory Data: Admission on 06/25/2016  Component Date Value Ref Range Status  . WBC 06/26/2016 12.8* 4.0 - 10.5 K/uL Final  . RBC 06/26/2016 3.81* 3.87 - 5.11 MIL/uL Final  . Hemoglobin 06/26/2016 11.7* 12.0 - 15.0 g/dL Final  . HCT 06/26/2016 35.0* 36.0 - 46.0 % Final  . MCV 06/26/2016 91.9  78.0 - 100.0 fL Final  . MCH 06/26/2016 30.7  26.0 - 34.0 pg Final  . MCHC 06/26/2016 33.4  30.0 - 36.0 g/dL Final  . RDW 06/26/2016 12.6  11.5 - 15.5 % Final  . Platelets 06/26/2016 319  150 - 400 K/uL Final  . Sodium 06/26/2016 143  135 - 145 mmol/L Final  . Potassium 06/26/2016 4.3  3.5 - 5.1 mmol/L Final  . Chloride 06/26/2016 109  101 - 111 mmol/L Final  . CO2 06/26/2016 30  22 - 32 mmol/L Final  . Glucose, Bld 06/26/2016 132* 65 - 99 mg/dL Final  . BUN 06/26/2016 <5*  6 - 20 mg/dL Final  . Creatinine, Ser 06/26/2016 0.62  0.44 - 1.00 mg/dL Final  . Calcium 06/26/2016 8.6* 8.9 - 10.3 mg/dL Final  . GFR calc non Af Amer 06/26/2016 >60  >60 mL/min Final  . GFR calc Af Amer 06/26/2016 >60  >60 mL/min Final   Comment: (NOTE) The eGFR has been calculated using the CKD EPI equation. This calculation has not been validated in all clinical situations. eGFR's persistently <60 mL/min signify possible Chronic Kidney Disease.   . Anion gap 06/26/2016 4* 5 - 15 Final  . WBC 06/27/2016 11.5* 4.0 - 10.5 K/uL Final  . RBC 06/27/2016 3.72* 3.87 - 5.11 MIL/uL Final  . Hemoglobin 06/27/2016 11.4* 12.0 - 15.0 g/dL Final  . HCT 06/27/2016 34.5* 36.0 - 46.0 % Final  . MCV 06/27/2016 92.7  78.0 - 100.0 fL Final  . MCH 06/27/2016 30.6  26.0 - 34.0 pg Final  . MCHC 06/27/2016 33.0  30.0 - 36.0 g/dL Final  . RDW 06/27/2016 12.8  11.5 - 15.5 % Final  . Platelets 06/27/2016 302  150 - 400 K/uL Final  . Sodium 06/27/2016 143  135 - 145 mmol/L Final  . Potassium 06/27/2016 4.1  3.5 - 5.1 mmol/L Final  . Chloride 06/27/2016 108  101 - 111 mmol/L Final  . CO2 06/27/2016 29  22 -  32 mmol/L Final  . Glucose, Bld 06/27/2016 105* 65 - 99 mg/dL Final  . BUN 06/27/2016 5* 6 - 20 mg/dL Final  . Creatinine, Ser 06/27/2016 0.46  0.44 - 1.00 mg/dL Final  . Calcium 06/27/2016 8.4* 8.9 - 10.3 mg/dL Final  . GFR calc non Af Amer 06/27/2016 >60  >60 mL/min Final  . GFR calc Af Amer 06/27/2016 >60  >60 mL/min Final   Comment: (NOTE) The eGFR has been calculated using the CKD EPI equation. This calculation has not been validated in all clinical situations. eGFR's persistently <60 mL/min signify possible Chronic Kidney Disease.   Felicia Gordon gap 06/27/2016 6  5 - 15 Final  Hospital Outpatient Visit on 06/19/2016  Component Date Value Ref Range Status  . aPTT 06/19/2016 28  24 - 36 seconds Final  . WBC 06/19/2016 4.4  4.0 - 10.5 K/uL Final  . RBC 06/19/2016 4.40  3.87 - 5.11 MIL/uL Final  .  Hemoglobin 06/19/2016 13.6  12.0 - 15.0 g/dL Final  . HCT 06/19/2016 40.3  36.0 - 46.0 % Final  . MCV 06/19/2016 91.6  78.0 - 100.0 fL Final  . MCH 06/19/2016 30.9  26.0 - 34.0 pg Final  . MCHC 06/19/2016 33.7  30.0 - 36.0 g/dL Final  . RDW 06/19/2016 12.4  11.5 - 15.5 % Final  . Platelets 06/19/2016 252  150 - 400 K/uL Final  . Sodium 06/19/2016 143  135 - 145 mmol/L Final  . Potassium 06/19/2016 3.5  3.5 - 5.1 mmol/L Final  . Chloride 06/19/2016 103  101 - 111 mmol/L Final  . CO2 06/19/2016 30  22 - 32 mmol/L Final  . Glucose, Bld 06/19/2016 97  65 - 99 mg/dL Final  . BUN 06/19/2016 9  6 - 20 mg/dL Final  . Creatinine, Ser 06/19/2016 0.62  0.44 - 1.00 mg/dL Final  . Calcium 06/19/2016 9.0  8.9 - 10.3 mg/dL Final  . Total Protein 06/19/2016 6.8  6.5 - 8.1 g/dL Final  . Albumin 06/19/2016 4.0  3.5 - 5.0 g/dL Final  . AST 06/19/2016 33  15 - 41 U/L Final  . ALT 06/19/2016 67* 14 - 54 U/L Final  . Alkaline Phosphatase 06/19/2016 62  38 - 126 U/L Final  . Total Bilirubin 06/19/2016 0.4  0.3 - 1.2 mg/dL Final  . GFR calc non Af Amer 06/19/2016 >60  >60 mL/min Final  . GFR calc Af Amer 06/19/2016 >60  >60 mL/min Final   Comment: (NOTE) The eGFR has been calculated using the CKD EPI equation. This calculation has not been validated in all clinical situations. eGFR's persistently <60 mL/min signify possible Chronic Kidney Disease.   . Anion gap 06/19/2016 10  5 - 15 Final  . Prothrombin Time 06/19/2016 12.8  11.4 - 15.2 seconds Final  . INR 06/19/2016 0.96   Final  . ABO/RH(D) 06/19/2016 O POS   Final  . Antibody Screen 06/19/2016 NEG   Final  . Sample Expiration 06/19/2016 06/28/2016   Final  . Extend sample reason 06/19/2016 NO TRANSFUSIONS OR PREGNANCY IN THE PAST 3 MONTHS   Final  . Preg, Serum 06/19/2016 NEGATIVE  NEGATIVE Final   Comment:        THE SENSITIVITY OF THIS METHODOLOGY IS >10 mIU/mL.   . MRSA, PCR 06/19/2016 NEGATIVE  NEGATIVE Final  . Staphylococcus aureus  06/19/2016 NEGATIVE  NEGATIVE Final   Comment:        The Xpert SA Assay (FDA approved for NASAL specimens in  patients over 35 years of age), is one component of a comprehensive surveillance program.  Test performance has been validated by [facility] for patients greater than or equal to 21 year old. It is not intended to diagnose infection nor to guide or monitor treatment.   . ABO/RH(D) 06/19/2016 O POS   Final     X-Rays:Dg Chest 2 View  Result Date: 06/19/2016 CLINICAL DATA:  Preop left total knee arthroplasty. No chest pain or shortness of breath. Residual cough from illness last week. EXAM: CHEST  2 VIEW COMPARISON:  None in PACs FINDINGS: The lungs are mildly hyperinflated and clear. The heart and pulmonary vascularity are normal. The mediastinum is normal in width. There is no pleural effusion. The bony thorax exhibits no acute abnormality. IMPRESSION: Hyperinflation consistent with COPD or reactive airway disease. No acute cardiopulmonary abnormality. Electronically Signed   By: David  Martinique M.D.   On: 06/19/2016 09:31    EKG:No orders found for this or any previous visit.   Hospital Course: NASIRA JANUSZ is a 46 y.o. who was admitted to [facility]. They were brought to the operating room on 06/25/2016 and underwent Procedure(s): LEFT KNEE POLYETHLENE EXCHANGE.  Patient tolerated the procedure well and was later transferred to the recovery room and then to the orthopaedic floor for postoperative care.  They were given PO and IV analgesics for pain control following their surgery.  They were given 24 hours of postoperative antibiotics of  Anti-infectives    Start     Dose/Rate Route Frequency Ordered Stop   06/25/16 1600  ceFAZolin (ANCEF) IVPB 2g/100 mL premix     2 g 200 mL/hr over 30 Minutes Intravenous Every 6 hours 06/25/16 1238 06/25/16 2327   06/25/16 0720  ceFAZolin (ANCEF) IVPB 2g/100 mL premix     2 g 200 mL/hr over 30 Minutes Intravenous On call to  O.R. 06/25/16 0720 06/25/16 0950     and started on DVT prophylaxis in the form of Xarelto.   PT and OT were ordered for total joint protocol.  Discharge planning consulted to help with postop disposition and equipment needs.  Patient had a tough night on the evening of surgery requiring IV pain medications and into the next day.  They started to get up OOB with therapy on day one. Hemovac drain was pulled without difficulty.  Continued to work with therapy into day two.  Dressing was changed on day two and the incision was healing well. Patient was seen in rounds on day two, feeling better and was ready to go home.   Discharge home - NO HHPT - Straight to outpatient therapy at [facility] starting Monday 2.26.2018 Diet - Cardiac diet Follow up - in 2 weeks Activity - WBAT Disposition - Home Condition Upon Discharge - pending  D/C Meds - See DC Summary DVT Prophylaxis - Xarelto   Discharge Instructions    Call MD / Call 911    Complete by:  As directed    If you experience chest pain or shortness of breath, CALL 911 and be transported to the hospital emergency room.  If you develope a fever above 101 F, pus (white drainage) or increased drainage or redness at the wound, or calf pain, call your surgeon's office.   Change dressing    Complete by:  As directed    Change dressing daily with sterile 4 x 4 inch gauze dressing and apply TED hose. Do not submerge the incision under water.   Constipation  Prevention    Complete by:  As directed    Drink plenty of fluids.  Prune juice may be helpful.  You may use a stool softener, such as Colace (over the counter) 100 mg twice a day.  Use MiraLax (over the counter) for constipation as needed.   Diet general    Complete by:  As directed    Discharge instructions    Complete by:  As directed    Pick up stool softner and laxative for home use following surgery while on pain medications. Do not submerge incision under water. Please use good hand washing  techniques while changing dressing each day. May shower starting three days after surgery. Please use a clean towel to pat the incision dry following showers. Continue to use ice for pain and swelling after surgery. Do not use any lotions or creams on the incision until instructed by your surgeon.  Wear both TED hose on both legs during the day every day for three weeks, but may have off at night at home.  Postoperative Constipation Protocol  Constipation - defined medically as fewer than three stools per week and severe constipation as less than one stool per week.  One of the most common issues patients have following surgery is constipation.  Even if you have a regular bowel pattern at home, your normal regimen is likely to be disrupted due to multiple reasons following surgery.  Combination of anesthesia, postoperative narcotics, change in appetite and fluid intake all can affect your bowels.  In order to avoid complications following surgery, here are some recommendations in order to help you during your recovery period.  Colace (docusate) - Pick up an over-the-counter form of Colace or another stool softener and take twice a day as long as you are requiring postoperative pain medications.  Take with a full glass of water daily.  If you experience loose stools or diarrhea, hold the colace until you stool forms back up.  If your symptoms do not get better within 1 week or if they get worse, check with your doctor.  Dulcolax (bisacodyl) - Pick up over-the-counter and take as directed by the product packaging as needed to assist with the movement of your bowels.  Take with a full glass of water.  Use this product as needed if not relieved by Colace only.   MiraLax (polyethylene glycol) - Pick up over-the-counter to have on hand.  MiraLax is a solution that will increase the amount of water in your bowels to assist with bowel movements.  Take as directed and can mix with a glass of water, juice,  soda, coffee, or tea.  Take if you go more than two days without a movement. Do not use MiraLax more than once per day. Call your doctor if you are still constipated or irregular after using this medication for 7 days in a row.  If you continue to have problems with postoperative constipation, please contact the office for further assistance and recommendations.  If you experience "the worst abdominal pain ever" or develop nausea or vomiting, please contact the office immediatly for further recommendations for treatment.   Take Xarelto for two and a half more weeks, then discontinue Xarelto. Once the patient has completed the blood thinner regimen, then take a Baby 81 mg Aspirin daily for three more weeks.   Do not put a pillow under the knee. Place it under the heel.    Complete by:  As directed    Do not sit  on low chairs, stoools or toilet seats, as it may be difficult to get up from low surfaces    Complete by:  As directed    Driving restrictions    Complete by:  As directed    No driving until released by the physician.   Increase activity slowly as tolerated    Complete by:  As directed    Lifting restrictions    Complete by:  As directed    No lifting until released by the physician.   Patient may shower    Complete by:  As directed    You may shower without a dressing once there is no drainage.  Do not wash over the wound.  If drainage remains, do not shower until drainage stops.   TED hose    Complete by:  As directed    Use stockings (TED hose) for 3 weeks on both leg(s).  You may remove them at night for sleeping.   Weight bearing as tolerated    Complete by:  As directed    Laterality:  left   Extremity:  Lower     Allergies as of 06/27/2016      Reactions   Nsaids Other (See Comments)   DUE TO ULCERS      Medication List    STOP taking these medications   FLINTSTONES GUMMIES PO   HYDROcodone-acetaminophen 5-325 MG tablet Commonly known as:  NORCO/VICODIN     MYZILRA tablet Generic drug:  levonorgestrel-ethinyl estradiol     TAKE these medications   ALPRAZolam 1 MG tablet Commonly known as:  XANAX Take 0.5 mg by mouth at bedtime as needed for sleep.   buPROPion 150 MG 12 hr tablet Commonly known as:  WELLBUTRIN SR Take 150 mg by mouth every evening.   diphenhydrAMINE 25 MG tablet Commonly known as:  BENADRYL Take 25 mg by mouth at bedtime.   FLUoxetine 20 MG capsule Commonly known as:  PROZAC Take 20 mg by mouth at bedtime.   gabapentin 300 MG capsule Commonly known as:  NEURONTIN Take 600 mg by mouth at bedtime. What changed:  Another medication with the same name was added. Make sure you understand how and when to take each.   gabapentin 300 MG capsule Commonly known as:  NEURONTIN Take 1 capsule (300 mg total) by mouth 2 (two) times daily. Take for three weeks, then discontinue the 300 mg dosing. What changed:  You were already taking a medication with the same name, and this prescription was added. Make sure you understand how and when to take each.   HYDROmorphone 4 MG tablet Commonly known as:  DILAUDID Take 1-2 tablets (4-8 mg total) by mouth every 4 (four) hours as needed for moderate pain or severe pain.   methocarbamol 500 MG tablet Commonly known as:  ROBAXIN Take 1 tablet (500 mg total) by mouth every 6 (six) hours as needed for muscle spasms.   montelukast 10 MG tablet Commonly known as:  SINGULAIR Take 10 mg by mouth at bedtime.   MUCINEX DM PO Take 1 tablet by mouth 2 (two) times daily as needed.   omeprazole 40 MG capsule Commonly known as:  PRILOSEC Take 40 mg by mouth daily.   rivaroxaban 10 MG Tabs tablet Commonly known as:  XARELTO Take 1 tablet (10 mg total) by mouth daily with breakfast. Take Xarelto for two and a half more weeks following discharge from the hospital, then discontinue Xarelto. Once the patient has completed the blood thinner  regimen, then take a Baby 81 mg Aspirin daily for three  more weeks.   traZODone 50 MG tablet Commonly known as:  DESYREL Take 50-100 mg by mouth at bedtime. DEPENDS ON DIFFICULTY SLEEPING IF TAKES 1-2 TABLETS      Follow-up Information    Gearlean Alf, MD. Schedule an appointment as soon as possible for a visit on 07/08/2016.   Specialty:  Orthopedic Surgery Contact information: [address] [place] [address] [phone]           Signed: Arlee Muslim, PA-C Orthopaedic Surgery 06/27/2016, 8:59 AM

## 2016-06-27 NOTE — Progress Notes (Signed)
   Subjective: 2 Days Post-Op Procedure(s) (LRB): LEFT KNEE POLYETHLENE EXCHANGE (Left) Patient reports pain as moderate and severe last evening.  Required IV pain medications. Patient seen in rounds with Dr. Lequita HaltAluisio.  Little better this morning. Patient is having problems with pain in the knee, requiring pain medications. Will change the oral pain medication to Dilaudid.  Will see how she does with this med change.  If doing better, and pain controlled with oral meds, then probably home later today. Patient is ready to go home if pain controlled with new med change.  Objective: Vital signs in last 24 hours: Temp:  [98.1 F (36.7 C)-99.4 F (37.4 C)] 98.1 F (36.7 C) (02/23 0624) Pulse Rate:  [74-95] 78 (02/23 0624) Resp:  [15] 15 (02/23 0624) BP: (116-141)/(69-90) 122/77 (02/23 0624) SpO2:  [95 %-98 %] 98 % (02/23 0624)  Intake/Output from previous day:  Intake/Output Summary (Last 24 hours) at 06/27/16 0757 Last data filed at 06/27/16 0625  Gross per 24 hour  Intake             1300 ml  Output                0 ml  Net             1300 ml    Intake/Output this shift: No intake/output data recorded.  Labs:  Recent Labs  06/26/16 0423 06/27/16 0412  HGB 11.7* 11.4*    Recent Labs  06/26/16 0423 06/27/16 0412  WBC 12.8* 11.5*  RBC 3.81* 3.72*  HCT 35.0* 34.5*  PLT 319 302    Recent Labs  06/26/16 0423 06/27/16 0412  NA 143 143  K 4.3 4.1  CL 109 108  CO2 30 29  BUN <5* 5*  CREATININE 0.62 0.46  GLUCOSE 132* 105*  CALCIUM 8.6* 8.4*   No results for input(s): LABPT, INR in the last 72 hours.  EXAM: General - Patient is Alert, Appropriate and Oriented Extremity - Neurovascular intact Sensation intact distally Intact pulses distally Dorsiflexion/Plantar flexion intact Incision - clean, dry, no drainage, healing Motor Function - intact, moving foot and toes well on exam.   Assessment/Plan: 2 Days Post-Op Procedure(s) (LRB): LEFT KNEE POLYETHLENE  EXCHANGE (Left) Procedure(s) (LRB): LEFT KNEE POLYETHLENE EXCHANGE (Left) Past Medical History:  Diagnosis Date  . Anxiety   . Depression   . PONV (postoperative nausea and vomiting)   . Stomach ulcer    hx of   Principal Problem:   Failed total knee arthroplasty, sequela Active Problems:   Failed total knee arthroplasty (HCC)   Unstable knee, left  Estimated body mass index is 24.63 kg/m as calculated from the following:   Height as of this encounter: 5\' 5"  (1.651 m).   Weight as of this encounter: 67.1 kg (148 lb). Up with therapy Discharge home - NO HHPT - [facility]2018 Diet - Cardiac diet Follow up - in 2 weeks Activity - WBAT Disposition - Home Condition Upon Discharge - pending  D/C Meds - See DC Summary DVT Prophylaxis - Xarelto  Avel Peacerew Marisue Canion, PA-C Orthopaedic Surgery 06/27/2016, 7:57 AM

## 2017-05-14 ENCOUNTER — Telehealth: Payer: Self-pay | Admitting: Gastroenterology

## 2017-05-19 NOTE — Telephone Encounter (Signed)
Dr. Adela LankArmbruster reviewed records and said "[facility] does see patients to be accessed for biliary disorders.  If she wishes to be seen by [facility] please have records reviewed by one of the MD's who perform ERCP, as I do not."  Dr. Christella HartiganJacobs is DOD for 05-19-17 and records and placed on his desk for review.

## 2017-05-20 NOTE — Telephone Encounter (Signed)
Dr. Christella HartiganJacobs reviewed records and recommends that the patient needs a tertiary GI referral [facility].   Called and LM on Vmail

## 2017-11-02 DIAGNOSIS — K838 Other specified diseases of biliary tract: Secondary | ICD-10-CM

## 2017-11-02 HISTORY — DX: Other specified diseases of biliary tract: K83.8

## 2017-11-27 IMAGING — CR DG CHEST 2V
2 series · 2 of 2 positions shown · non-contrast
Comparison: None in PACs

CLINICAL DATA: Preop left total knee arthroplasty. No chest pain or
shortness of breath. Residual cough from illness last week.

EXAM:
CHEST  2 VIEW

[w chest pa]
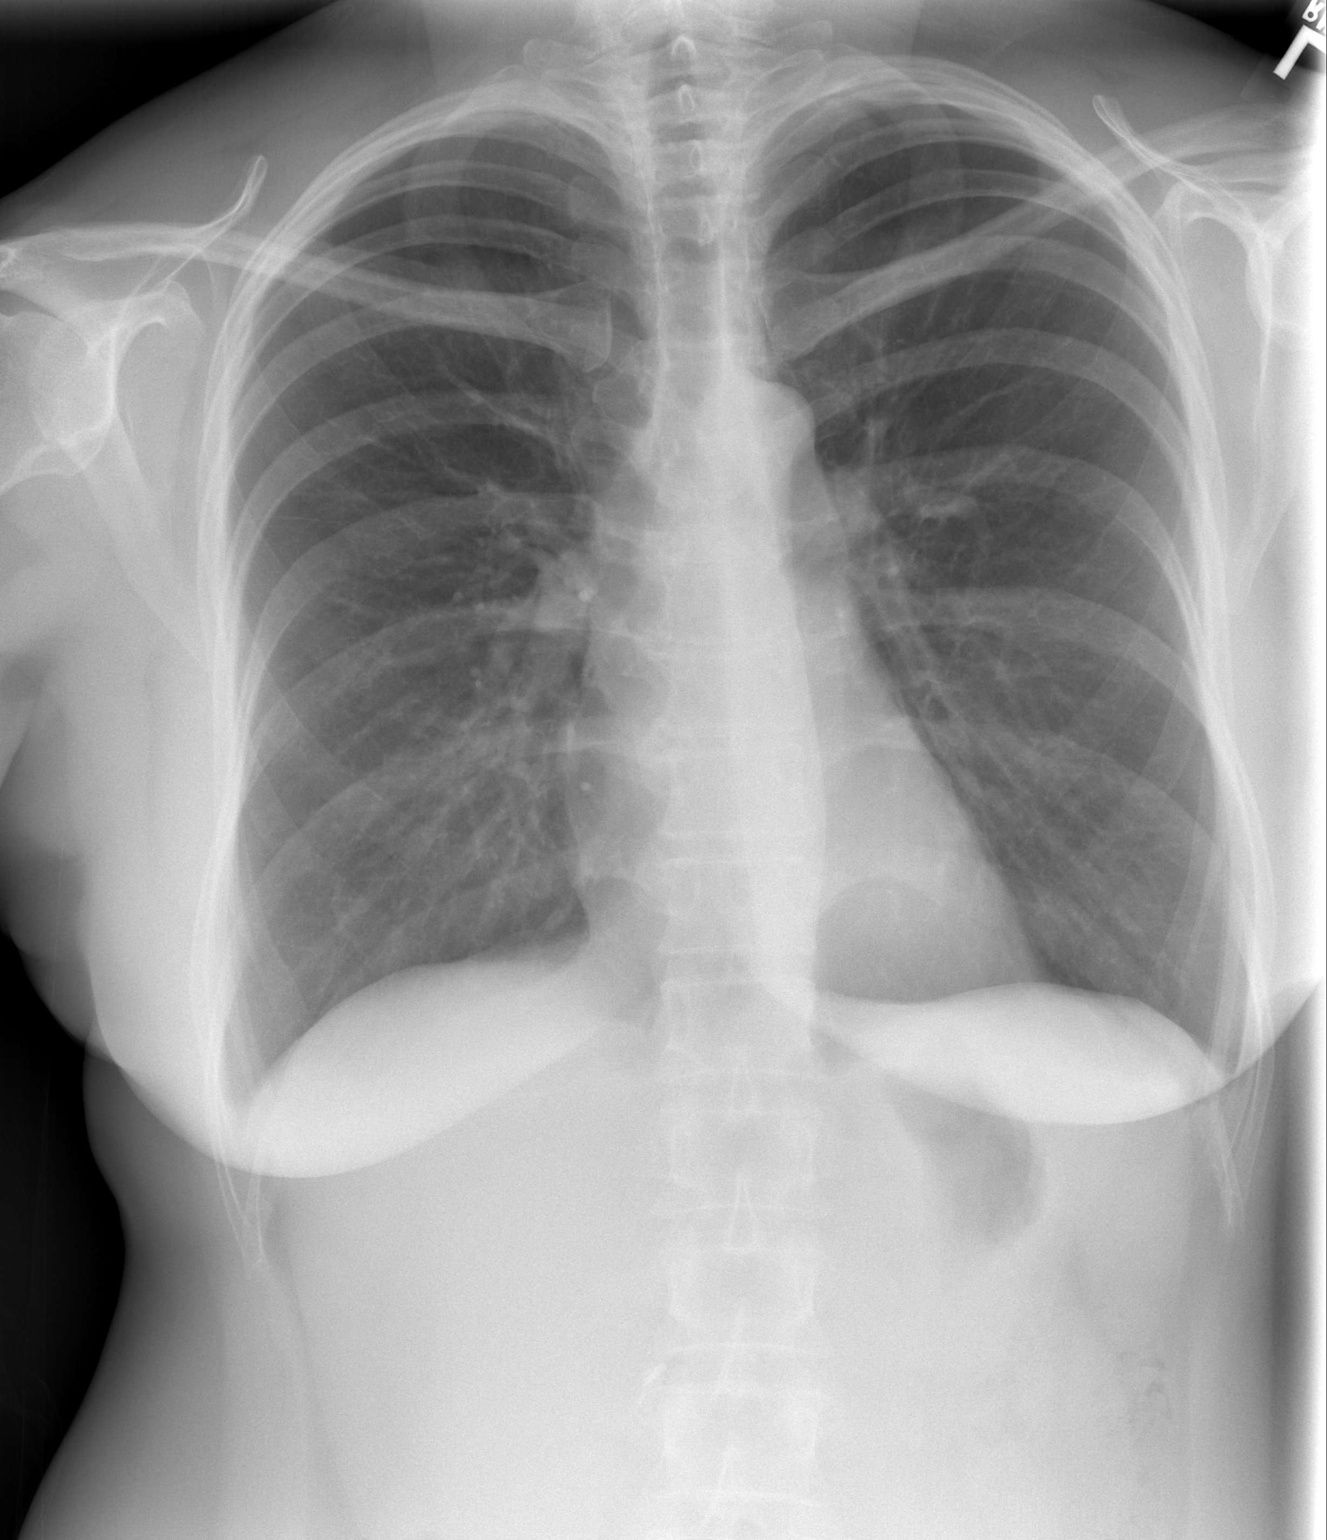

[w chest lat]
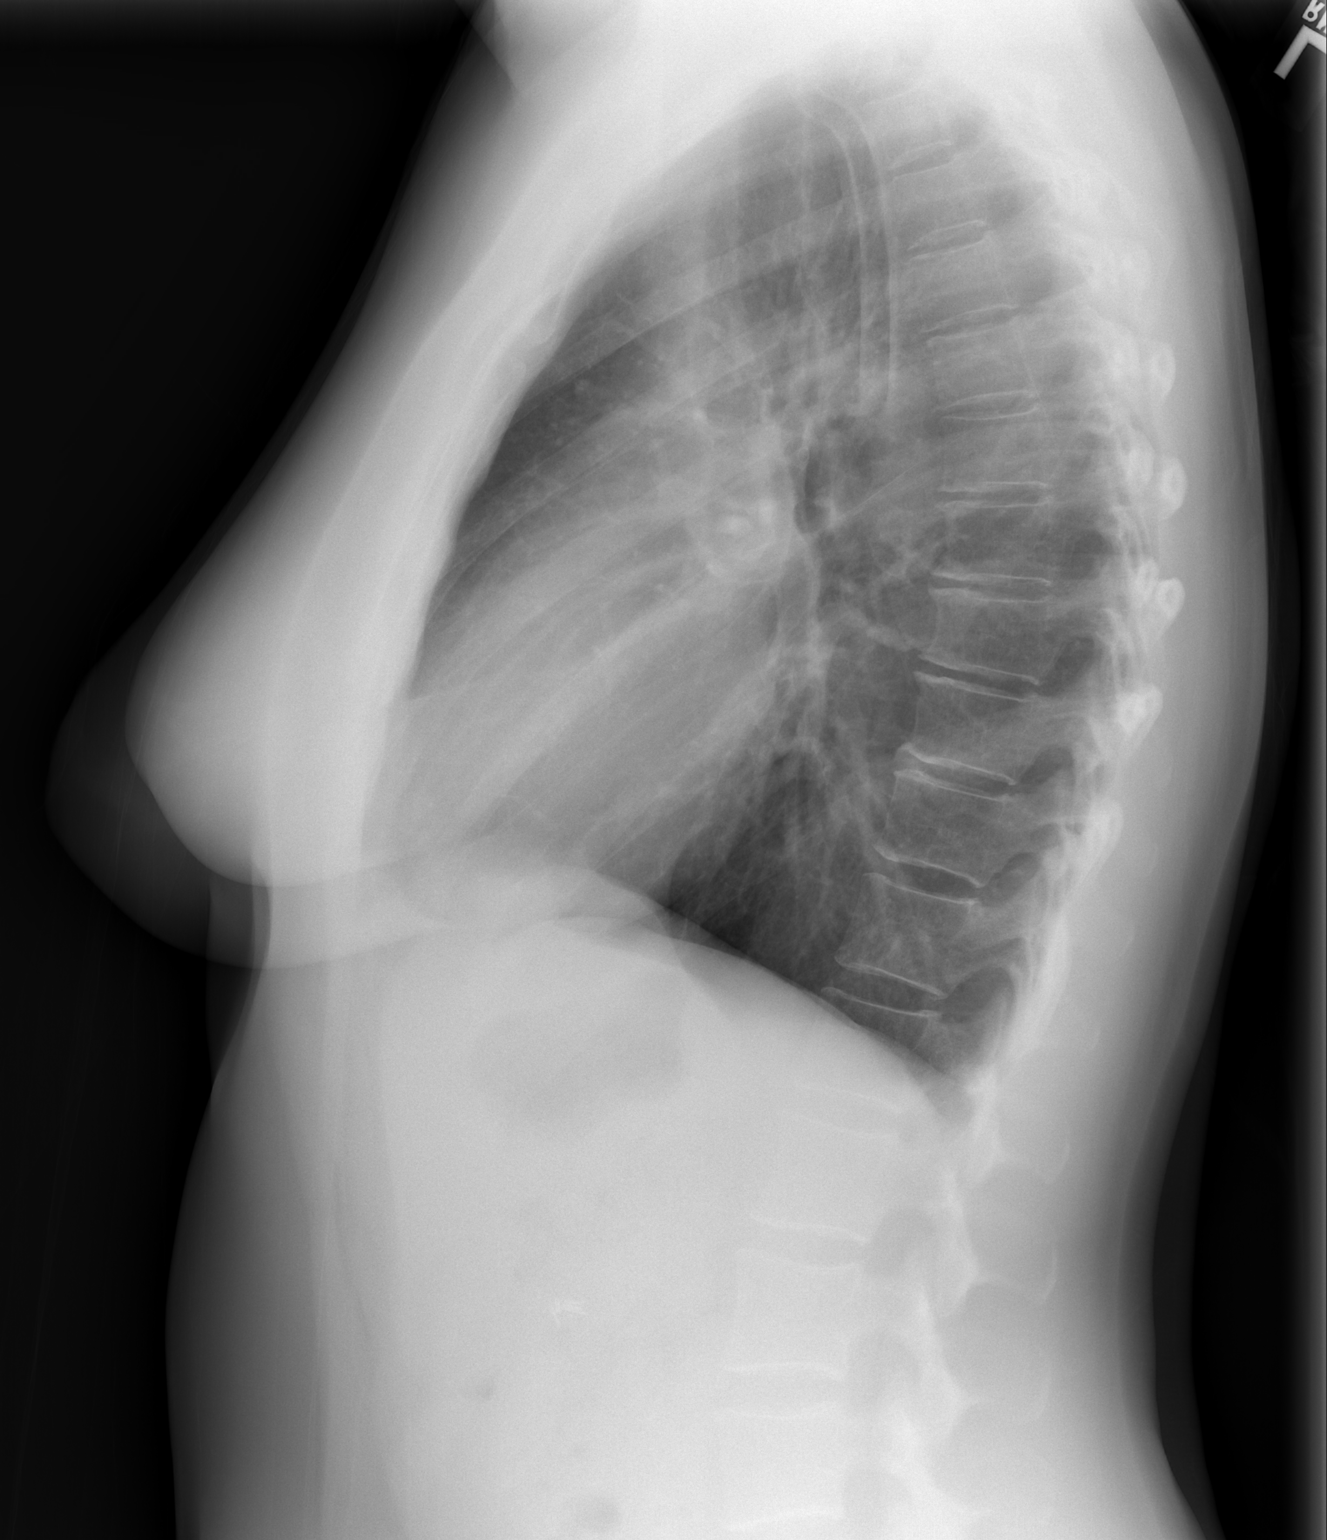

[2 of 2 positions shown; findings below may reference images not displayed]

FINDINGS: The lungs are mildly hyperinflated and clear. The heart and
pulmonary vascularity are normal. The mediastinum is normal in
width. There is no pleural effusion. The bony thorax exhibits no
acute abnormality.
IMPRESSION: Hyperinflation consistent with COPD or reactive airway disease. No
acute cardiopulmonary abnormality.

## 2018-03-09 NOTE — H&P (Signed)
TOTAL KNEE REVISION ADMISSION H&P  Patient is being admitted for left revision total knee arthroplasty.  Subjective:  Chief Complaint:left knee pain.  HPI: Felicia Gordon, 47 y.o. female, has a history of pain and functional disability in the left knee(s) due to failed previous arthroplasty and patient has failed non-surgical conservative treatments for greater than 12 weeks to include previous polyethylene revision. The indications for the revision of the total knee arthroplasty are instability and pain. Onset of symptoms was abrupt starting earlier this year with gradually worsening course since that time.  Prior procedures on the left knee(s) include arthroplasty and polyethylene revision.  Patient currently rates pain in the left knee(s) at 7 out of 10 with activity. There is worsening of pain with activity and weight bearing and instability and laxity. Previous radiographs of the left knee show the tibial component in slight varus with overall good alignment. Bone scan of the knee did not show any definitive loosening, or signs of synovitis or infection. There is no current active infection.   Patient Active Problem List   Diagnosis Date Noted  . Unstable knee, left 06/26/2016  . 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 Failed total knee arthroplasty (HCC) 06/25/2016   Past Medical History:  Diagnosis Date  . Anxiety   . Depression   . PONV (postoperative nausea and vomiting)   . Stomach ulcer    hx of    Past Surgical History:  Procedure Laterality Date  . APPENDECTOMY    . CHOLECYSTECTOMY    . JOINT REPLACEMENT     L knee, 5 total surgeries secondary to an injurt  . TOTAL KNEE REVISION Left 06/25/2016   Procedure: LEFT KNEE POLYETHLENE EXCHANGE;  Surgeon: Ollen Gross, MD;  Location: [facility];  Service: Orthopedics;  Laterality: Left;  . WRIST FRACTURE SURGERY      No current facility-administered medications for this encounter.    Current Outpatient  Medications  Medication Sig Dispense Refill Last Dose  . ALPRAZolam (XANAX) 1 MG tablet Take 0.5 mg by mouth at bedtime as needed for sleep.   06/24/2016 at pm  . buPROPion (WELLBUTRIN SR) 150 MG 12 hr tablet Take 150 mg by mouth every evening.   06/24/2016 at pm  . diphenhydrAMINE (BENADRYL) 25 MG tablet Take 25 mg by mouth at bedtime.   06/24/2016 at pm  . FLUoxetine (PROZAC) 20 MG capsule Take 20 mg by mouth at bedtime.   06/24/2016 at pm  . gabapentin (NEURONTIN) 300 MG capsule Take 1 capsule (300 mg total) by mouth 2 (two) times daily. Take for three weeks, then discontinue the 300 mg dosing. (Patient taking differently: Take 600 mg by mouth at bedtime. Take for three weeks, then discontinue the 300 mg dosing.) 42 capsule 0   . HYDROmorphone (DILAUDID) 2 MG tablet Take 2 mg by mouth 3 (three) times daily.     . hyoscyamine (LEVSIN, ANASPAZ) 0.125 MG tablet Take 0.125 mg by mouth every 6 (six) hours as needed for bladder spasms.     . montelukast (SINGULAIR) 10 MG tablet Take 10 mg by mouth at bedtime.   06/24/2016 at pm  . traZODone (DESYREL) 50 MG tablet Take 50 mg by mouth at bedtime.    06/24/2016 at pm  . HYDROmorphone (DILAUDID) 4 MG tablet Take 1-2 tablets (4-8 mg total) by mouth every 4 (four) hours as needed for moderate pain or severe pain. (Patient not taking: Reported on 03/08/2018) 84 tablet 0 Not Taking at Unknown time  .  methocarbamol (ROBAXIN) 500 MG tablet Take 1 tablet (500 mg total) by mouth every 6 (six) hours as needed for muscle spasms. (Patient not taking: Reported on 03/08/2018) 80 tablet 0 Not Taking at Unknown time  . rivaroxaban (XARELTO) 10 MG TABS tablet Take 1 tablet (10 mg total) by mouth daily with breakfast. Take Xarelto for two and a half more weeks following discharge from the hospital, then discontinue Xarelto. Once the patient has completed the blood thinner regimen, then take a Baby 81 mg Aspirin daily for three more weeks. (Patient not taking: Reported on 03/08/2018)  19 tablet 0 Not Taking at Unknown time   Allergies  Allergen Reactions  . Nsaids Other (See Comments)    DUE TO ULCERS    Social History   Tobacco Use  . Smoking status: Never Smoker  . Smokeless tobacco: Never Used  Substance Use Topics  . Alcohol use: No    No family history on file.    Review of Systems  Constitutional: Negative for chills and fever.  HENT: Negative for congestion, sore throat and tinnitus.   Eyes: Negative for double vision, photophobia and pain.  Respiratory: Negative for cough, shortness of breath and wheezing.   Cardiovascular: Negative for chest pain, palpitations and orthopnea.  Gastrointestinal: Negative for heartburn, nausea and vomiting.  Genitourinary: Negative for dysuria, frequency and urgency.  Musculoskeletal: Positive for joint pain.  Neurological: Negative for dizziness, weakness and headaches.     Objective:  Physical Exam  Well nourished and well developed. General: Alert and oriented x3, cooperative and pleasant, no acute distress. Head: normocephalic, atraumatic, neck supple. Eyes: EOMI. Respiratory: breath sounds clear in all fields, no wheezing, rales, or rhonchi. Cardiovascular: Regular rate and rhythm, no murmurs, gallops or rubs.  Abdomen: non-tender to palpation and soft, normoactive bowel sounds. Musculoskeletal: Left Knee Exam: Trace effusion. A fair amount of varus/valgus play is noted. It is progressively worsening. She also has some A/P laxity in flexion. Range of motion is -5 -120 degrees. No crepitus on range of motion of the knee. No medial or lateral joint line tenderness. Stable knee. Calves soft and nontender. Motor function intact in LE. Strength 5/5 LE bilaterally. Neuro: Distal pulses 2+. Sensation to light touch intact in LE.  Vital signs in last 24 hours: Blood pressure: 146/90 mmHg Pulse: 64 bpm  Labs:  Estimated body mass index is 24.63 kg/m as calculated from the following:   Height as of 06/25/16: 5'  5" (1.651 m).   Weight as of 06/25/16: 67.1 kg.  Imaging Review Previous radiographs of the left knee show the tibial component in slight varus with overall good alignment. Bone scan of the knee did not show any definitive loosening, or signs of synovitis or infection.    Preoperative templating of the joint replacement has been completed, documented, and submitted to the Operating Room personnel in order to optimize intra-operative equipment management.   Assessment/Plan:  End stage arthritis, left knee(s) with failed previous arthroplasty.   The patient history, physical examination, clinical judgment of the provider and imaging studies are consistent with end stage degenerative joint disease of the left knee(s), previous total knee arthroplasty. Revision total knee arthroplasty is deemed medically necessary. The treatment options including medical management, injection therapy, arthroscopy and revision arthroplasty were discussed at length. The risks and benefits of revision total knee arthroplasty were presented and reviewed. The risks due to aseptic loosening, infection, stiffness, patella tracking problems, thromboembolic complications and other imponderables were discussed. The patient acknowledged the  explanation, agreed to proceed with the plan and consent was signed. Patient is being admitted for inpatient treatment for surgery, pain control, PT, OT, prophylactic antibiotics, VTE prophylaxis, progressive ambulation and ADL's and discharge planning.The patient is planning to be discharged home with outpatient physical therapy.   Therapy Plans: outpatient therapy at [facility] Disposition: Home with daughter and parents nearby Planned DVT Prophylaxis: Aspirin 325 mg BID DME needed: None PCP: Dr. 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 Last DEXA scan  showed bone density score of -1.2.  - Patient was instructed on what medications to stop prior to surgery. - Follow-up visit in 2 weeks with Dr. Lequita Halt - Begin physical therapy following surgery - Pre-operative lab work as pre-surgical testing - Prescriptions will be provided in hospital at time of discharge  Arther Abbott, PA-C Orthopedic Surgery [facility] [place]

## 2018-03-11 NOTE — Patient Instructions (Signed)
Felicia Gordon  03/11/2018   Your procedure is scheduled on: 03-17-18   Report to [facility] Main  Entrance    Report to admitting at 6:00AM    Call this number if you have problems the morning of surgery [phone]     Remember: Do not eat food or drink liquids :After Midnight. BRUSH YOUR TEETH MORNING OF SURGERY AND RINSE YOUR MOUTH OUT, NO CHEWING GUM CANDY OR MINTS.     Take these medicines the morning of surgery with A SIP OF WATER: NONE                                You may not have any metal on your body including hair pins and              piercings  Do not wear jewelry, make-up, lotions, powders or perfumes, deodorant             Do not wear nail polish.  Do not shave  48 hours prior to surgery.                Do not bring valuables to the hospital. [facility] IS NOT             RESPONSIBLE   FOR VALUABLES.  Contacts, dentures or bridgework may not be worn into surgery.  Leave suitcase in the car. After surgery it may be brought to your room.                 Please read over the following fact sheets you were given: _____________________________________________________________________             [facility] - Preparing for Surgery Before surgery, you can play an important role.  Because skin is not sterile, your skin needs to be as free of germs as possible.  You can reduce the number of germs on your skin by washing with CHG (chlorahexidine gluconate) soap before surgery.  CHG is an antiseptic cleaner which kills germs and bonds with the skin to continue killing germs even after washing. Please DO NOT use if you have an allergy to CHG or antibacterial soaps.  If your skin becomes reddened/irritated stop using the CHG and inform your nurse when you arrive at Short Stay. Do not shave (including legs and underarms) for at least 48 hours prior to the first CHG shower.  You may shave your face/neck. Please follow these instructions  carefully:  1.  Shower with CHG Soap the night before surgery and the  morning of Surgery.  2.  If you choose to wash your hair, wash your hair first as usual with your  normal  shampoo.  3.  After you shampoo, rinse your hair and body thoroughly to remove the  shampoo.                           4.  Use CHG as you would any other liquid soap.  You can apply chg directly  to the skin and wash                       Gently with a scrungie or clean washcloth.  5.  Apply the CHG Soap to your body ONLY FROM THE NECK DOWN.   Do  not use on face/ open                           Wound or open sores. Avoid contact with eyes, ears mouth and genitals (private parts).                       Wash face,  Genitals (private parts) with your normal soap.             6.  Wash thoroughly, paying special attention to the area where your surgery  will be performed.  7.  Thoroughly rinse your body with warm water from the neck down.  8.  DO NOT shower/wash with your normal soap after using and rinsing off  the CHG Soap.                9.  Pat yourself dry with a clean towel.            10.  Wear clean pajamas.            11.  Place clean sheets on your bed the night of your first shower and do not  sleep with pets. Day of Surgery : Do not apply any lotions/deodorants the morning of surgery.  Please wear clean clothes to the hospital/surgery center.  FAILURE TO FOLLOW THESE INSTRUCTIONS MAY RESULT IN THE CANCELLATION OF YOUR SURGERY PATIENT SIGNATURE_________________________________  NURSE SIGNATURE__________________________________  ________________________________________________________________________   Adam Phenix  An incentive spirometer is a tool that can help keep your lungs clear and active. This tool measures how well you are filling your lungs with each breath. Taking long deep breaths may help reverse or decrease the chance of developing breathing (pulmonary) problems (especially infection)  following:  A long period of time when you are unable to move or be active. BEFORE THE PROCEDURE   If the spirometer includes an indicator to show your best effort, your nurse or respiratory therapist will set it to a desired goal.  If possible, sit up straight or lean slightly forward. Try not to slouch.  Hold the incentive spirometer in an upright position. INSTRUCTIONS FOR USE  1. Sit on the edge of your bed if possible, or sit up as far as you can in bed or on a chair. 2. Hold the incentive spirometer in an upright position. 3. Breathe out normally. 4. Place the mouthpiece in your mouth and seal your lips tightly around it. 5. Breathe in slowly and as deeply as possible, raising the piston or the ball toward the top of the column. 6. Hold your breath for 3-5 seconds or for as long as possible. Allow the piston or ball to fall to the bottom of the column. 7. Remove the mouthpiece from your mouth and breathe out normally. 8. Rest for a few seconds and repeat Steps 1 through 7 at least 10 times every 1-2 hours when you are awake. Take your time and take a few normal breaths between deep breaths. 9. The spirometer may include an indicator to show your best effort. Use the indicator as a goal to work toward during each repetition. 10. After each set of 10 deep breaths, practice coughing to be sure your lungs are clear. If you have an incision (the cut made at the time of surgery), support your incision when coughing by placing a pillow or rolled up towels firmly against it. Once you are able to get out of bed,  walk around indoors and cough well. You may stop using the incentive spirometer when instructed by your caregiver.  RISKS AND COMPLICATIONS  Take your time so you do not get dizzy or light-headed.  If you are in pain, you may need to take or ask for pain medication before doing incentive spirometry. It is harder to take a deep breath if you are having pain. AFTER USE  Rest and  breathe slowly and easily.  It can be helpful to keep track of a log of your progress. Your caregiver can provide you with a simple table to help with this. If you are using the spirometer at home, follow these instructions: [place] IF:   You are having difficultly using the spirometer.  You have trouble using the spirometer as often as instructed.  Your pain medication is not giving enough relief while using the spirometer.  You develop fever of 100.5 F (38.1 C) or higher. SEEK IMMEDIATE MEDICAL CARE IF:   You cough up bloody sputum that had not been present before.  You develop fever of 102 F (38.9 C) or greater.  You develop worsening pain at or near the incision site. MAKE SURE YOU:   Understand these instructions.  Will watch your condition.  Will get help right away if you are not doing well or get worse. Document Released: 09/01/2006 Document Revised: 07/14/2011 Document Reviewed: 11/02/2006 [employer] Patient Information 2014 [employer], [place].   ________________________________________________________________________  WHAT IS A BLOOD TRANSFUSION? Blood Transfusion Information  A transfusion is the replacement of blood or some of its parts. Blood is made up of multiple cells which provide different functions.  Red blood cells carry oxygen and are used for blood loss replacement.  White blood cells fight against infection.  Platelets control bleeding.  Plasma helps clot blood.  Other blood products are available for specialized needs, such as hemophilia or other clotting disorders. BEFORE THE TRANSFUSION  Who gives blood for transfusions?   Healthy volunteers who are fully evaluated to make sure their blood is safe. This is blood bank blood. Transfusion therapy is the safest it has ever been in the practice of medicine. Before blood is taken from a donor, a complete history is taken to make sure that person has no history of diseases nor engages in  risky social behavior (examples are intravenous drug use or sexual activity with multiple partners). The donor's travel history is screened to minimize risk of transmitting infections, such as malaria. The donated blood is tested for signs of infectious diseases, such as HIV and hepatitis. The blood is then tested to be sure it is compatible with you in order to minimize the chance of a transfusion reaction. If you or a relative donates blood, this is often done in anticipation of surgery and is not appropriate for emergency situations. It takes many days to process the donated blood. RISKS AND COMPLICATIONS Although transfusion therapy is very safe and saves many lives, the main dangers of transfusion include:   Getting an infectious disease.  Developing a transfusion reaction. This is an allergic reaction to something in the blood you were given. Every precaution is taken to prevent this. The decision to have a blood transfusion has been considered carefully by your caregiver before blood is given. Blood is not given unless the benefits outweigh the risks. AFTER THE TRANSFUSION  Right after receiving a blood transfusion, you will usually feel much better and more energetic. This is especially true if your red blood cells  have gotten low (anemic). The transfusion raises the level of the red blood cells which carry oxygen, and this usually causes an energy increase.  The nurse administering the transfusion will monitor you carefully for complications. HOME CARE INSTRUCTIONS  No special instructions are needed after a transfusion. You may find your energy is better. Speak with your caregiver about any limitations on activity for underlying diseases you may have. SEEK MEDICAL CARE IF:   Your condition is not improving after your transfusion.  You develop redness or irritation at the intravenous (IV) site. SEEK IMMEDIATE MEDICAL CARE IF:  Any of the following symptoms occur over the next 12  hours:  Shaking chills.  You have a temperature by mouth above 102 F (38.9 C), not controlled by medicine.  Chest, back, or muscle pain.  People around you feel you are not acting correctly or are confused.  Shortness of breath or difficulty breathing.  Dizziness and fainting.  You get a rash or develop hives.  You have a decrease in urine output.  Your urine turns a dark color or changes to pink, red, or brown. Any of the following symptoms occur over the next 10 days:  You have a temperature by mouth above 102 F (38.9 C), not controlled by medicine.  Shortness of breath.  Weakness after normal activity.  The white part of the eye turns yellow (jaundice).  You have a decrease in the amount of urine or are urinating less often.  Your urine turns a dark color or changes to pink, red, or brown. Document Released: 04/18/2000 Document Revised: 07/14/2011 Document Reviewed: 12/06/2007 [facility] Patient Information 2014 [place], [place].  _______________________________________________________________________

## 2018-03-12 ENCOUNTER — Encounter (HOSPITAL_COMMUNITY)
Admission: RE | Admit: 2018-03-12 | Discharge: 2018-03-12 | Disposition: A | Discharge status: Home / Self Care | Payer: BLUE CROSS/BLUE SHIELD | Source: Ambulatory Visit | Attending: Orthopedic Surgery | Admitting: Orthopedic Surgery

## 2018-03-12 ENCOUNTER — Encounter (HOSPITAL_COMMUNITY): Payer: Self-pay | Admitting: Emergency Medicine

## 2018-03-12 ENCOUNTER — Other Ambulatory Visit: Payer: Self-pay

## 2018-03-12 DIAGNOSIS — T84023A Instability of internal left knee prosthesis, initial encounter: Secondary | ICD-10-CM | POA: Diagnosis not present

## 2018-03-12 DIAGNOSIS — Y838 Other surgical procedures as the cause of abnormal reaction of the patient, or of later complication, without mention of misadventure at the time of the procedure: Secondary | ICD-10-CM | POA: Diagnosis not present

## 2018-03-12 DIAGNOSIS — Z01812 Encounter for preprocedural laboratory examination: Secondary | ICD-10-CM | POA: Insufficient documentation

## 2018-03-12 HISTORY — DX: Other specified diseases of biliary tract: K83.8

## 2018-03-12 LAB — CBC
HCT: 45 % (ref 36.0–46.0)
HEMOGLOBIN: 14.4 g/dL (ref 12.0–15.0)
MCH: 30.3 pg (ref 26.0–34.0)
MCHC: 32 g/dL (ref 30.0–36.0)
MCV: 94.5 fL (ref 80.0–100.0)
NRBC: 0 % (ref 0.0–0.2)
PLATELETS: 369 10*3/uL (ref 150–400)
RBC: 4.76 MIL/uL (ref 3.87–5.11)
RDW: 12.1 % (ref 11.5–15.5)
WBC: 6.3 10*3/uL (ref 4.0–10.5)

## 2018-03-12 LAB — COMPREHENSIVE METABOLIC PANEL
ALBUMIN: 3.9 g/dL (ref 3.5–5.0)
ALK PHOS: 51 U/L (ref 38–126)
ALT: 20 U/L (ref 0–44)
AST: 29 U/L (ref 15–41)
Anion gap: 11 (ref 5–15)
BUN: 7 mg/dL (ref 6–20)
CALCIUM: 9.1 mg/dL (ref 8.9–10.3)
CO2: 26 mmol/L (ref 22–32)
CREATININE: 0.77 mg/dL (ref 0.44–1.00)
Chloride: 103 mmol/L (ref 98–111)
GFR calc non Af Amer: >60 mL/min (ref >60)
GLUCOSE: 95 mg/dL (ref 70–99)
Potassium: 3.8 mmol/L (ref 3.5–5.1)
SODIUM: 140 mmol/L (ref 135–145)
Total Bilirubin: 0.6 mg/dL (ref 0.3–1.2)
Total Protein: 6.8 g/dL (ref 6.5–8.1)

## 2018-03-12 LAB — SURGICAL PCR SCREEN
MRSA, PCR: NEGATIVE
STAPHYLOCOCCUS AUREUS: NEGATIVE

## 2018-03-12 LAB — HCG, SERUM, QUALITATIVE: PREG SERUM: NEGATIVE

## 2018-03-12 LAB — PROTIME-INR
INR: 0.95
Prothrombin Time: 12.6 seconds (ref 11.4–15.2)

## 2018-03-12 LAB — APTT: APTT: 30 s (ref 24–36)

## 2018-03-16 MED ORDER — BUPIVACAINE LIPOSOME 1.3 % IJ SUSP
20.0000 mL | INTRAMUSCULAR | Status: AC
  Filled 2018-03-16: qty 20

## 2018-03-16 NOTE — Anesthesia Preprocedure Evaluation (Addendum)
Anesthesia Evaluation   Patient awake    Reviewed: Allergy & Precautions, NPO status , Patient's Chart, lab work & pertinent test results  History of Anesthesia Complications (+) PONV  Airway Mallampati: II  TM Distance: <3 FB Neck ROM: Full    Dental no notable dental hx. (+) Teeth Intact, Dental Advisory Given   Pulmonary neg pulmonary ROS,    Pulmonary exam normal breath sounds clear to auscultation       Cardiovascular Exercise Tolerance: Good negative cardio ROS   Rhythm:Regular Rate:Normal     Neuro/Psych PSYCHIATRIC DISORDERS Anxiety negative neurological ROS     GI/Hepatic Neg liver ROS, PUD,   Endo/Other  negative endocrine ROS  Renal/GU negative Renal ROS     Musculoskeletal negative musculoskeletal ROS (+)   Abdominal (+) + obese,   Peds  Hematology negative hematology ROS (+)   Anesthesia Other Findings   Reproductive/Obstetrics                            Lab Results  Component Value Date   CREATININE 0.77 03/12/2018   BUN 7 03/12/2018   NA 140 03/12/2018   K 3.8 03/12/2018   CL 103 03/12/2018   CO2 26 03/12/2018    Lab Results  Component Value Date   WBC 6.3 03/12/2018   HGB 14.4 03/12/2018   HCT 45.0 03/12/2018   MCV 94.5 03/12/2018   PLT 369 03/12/2018    Anesthesia Physical Anesthesia Plan  ASA: II  Anesthesia Plan: General   Post-op Pain Management:  [facility] for Post-op pain   Induction: Intravenous  PONV Risk Score and Plan: 4 or greater and Treatment may vary due to age or medical condition, Scopolamine patch - Pre-op, Midazolam, Dexamethasone and Ondansetron  Airway Management Planned: Oral ETT  Additional Equipment:   Intra-op Plan:   Post-operative Plan: Extubation in OR  Informed Consent: I have reviewed the patients History and Physical, chart, labs and discussed the procedure including the risks, benefits and alternatives for the  proposed anesthesia with the patient or authorized representative who has indicated his/her understanding and acceptance.   Dental advisory given  Plan Discussed with: CRNA  Anesthesia Plan Comments:        Anesthesia Quick Evaluation

## 2018-03-17 ENCOUNTER — Inpatient Hospital Stay (HOSPITAL_COMMUNITY): Payer: BLUE CROSS/BLUE SHIELD | Admitting: Anesthesiology

## 2018-03-17 ENCOUNTER — Encounter (HOSPITAL_COMMUNITY)
Admission: RE | Disposition: A | Discharge status: Home / Self Care | Payer: Self-pay | Source: Home / Self Care | Attending: Orthopedic Surgery

## 2018-03-17 ENCOUNTER — Inpatient Hospital Stay (HOSPITAL_COMMUNITY)
Admission: RE | Admit: 2018-03-17 | Discharge: 2018-03-20 | DRG: 468 | Disposition: A | Discharge status: Home / Self Care | Payer: BLUE CROSS/BLUE SHIELD | Attending: Orthopedic Surgery | Admitting: Orthopedic Surgery

## 2018-03-17 ENCOUNTER — Encounter (HOSPITAL_COMMUNITY): Payer: Self-pay | Admitting: Anesthesiology

## 2018-03-17 ENCOUNTER — Other Ambulatory Visit: Payer: Self-pay

## 2018-03-17 DIAGNOSIS — T84023A Instability of internal left knee prosthesis, initial encounter: Principal | ICD-10-CM | POA: Diagnosis present

## 2018-03-17 DIAGNOSIS — F419 Anxiety disorder, unspecified: Secondary | ICD-10-CM | POA: Diagnosis present

## 2018-03-17 DIAGNOSIS — Z79891 Long term (current) use of opiate analgesic: Secondary | ICD-10-CM | POA: Diagnosis not present

## 2018-03-17 DIAGNOSIS — T84018A Broken internal joint prosthesis, other site, initial encounter: Secondary | ICD-10-CM

## 2018-03-17 DIAGNOSIS — F329 Major depressive disorder, single episode, unspecified: Secondary | ICD-10-CM | POA: Diagnosis present

## 2018-03-17 DIAGNOSIS — Z79899 Other long term (current) drug therapy: Secondary | ICD-10-CM | POA: Diagnosis not present

## 2018-03-17 DIAGNOSIS — Y792 Prosthetic and other implants, materials and accessory orthopedic devices associated with adverse incidents: Secondary | ICD-10-CM | POA: Diagnosis present

## 2018-03-17 DIAGNOSIS — E669 Obesity, unspecified: Secondary | ICD-10-CM | POA: Diagnosis present

## 2018-03-17 DIAGNOSIS — M1712 Unilateral primary osteoarthritis, left knee: Secondary | ICD-10-CM | POA: Diagnosis present

## 2018-03-17 DIAGNOSIS — Z6827 Body mass index (BMI) 27.0-27.9, adult: Secondary | ICD-10-CM

## 2018-03-17 DIAGNOSIS — Z96659 Presence of unspecified artificial knee joint: Secondary | ICD-10-CM

## 2018-03-17 DIAGNOSIS — M25362 Other instability, left knee: Secondary | ICD-10-CM

## 2018-03-17 DIAGNOSIS — T84018S Broken internal joint prosthesis, other site, sequela: Secondary | ICD-10-CM

## 2018-03-17 HISTORY — PX: TOTAL KNEE REVISION: SHX996

## 2018-03-17 LAB — TYPE AND SCREEN
ABO/RH(D): O POS
Antibody Screen: NEGATIVE

## 2018-03-17 SURGERY — TOTAL KNEE REVISION
Anesthesia: General | Site: Knee | Laterality: Left

## 2018-03-17 MED ORDER — ONDANSETRON HCL 4 MG/2ML IJ SOLN
INTRAMUSCULAR | Status: AC
  Filled 2018-03-17: qty 2

## 2018-03-17 MED ORDER — DEXAMETHASONE SODIUM PHOSPHATE 10 MG/ML IJ SOLN
10.0000 mg | Freq: Once | INTRAMUSCULAR | Status: AC
  Administered 2018-03-18: 10 mg via INTRAVENOUS
  Filled 2018-03-17: qty 1

## 2018-03-17 MED ORDER — PROPOFOL 500 MG/50ML IV EMUL
INTRAVENOUS | Status: DC | PRN
  Administered 2018-03-17: 150 ug/kg/min via INTRAVENOUS

## 2018-03-17 MED ORDER — ALBUMIN HUMAN 5 % IV SOLN
INTRAVENOUS | Status: AC
  Filled 2018-03-17: qty 250

## 2018-03-17 MED ORDER — KETAMINE HCL 10 MG/ML IJ SOLN
INTRAMUSCULAR | Status: DC | PRN
  Administered 2018-03-17 (×2): 10 mg via INTRAVENOUS
  Administered 2018-03-17: 30 mg via INTRAVENOUS

## 2018-03-17 MED ORDER — HYDROMORPHONE HCL 2 MG/ML IJ SOLN
INTRAMUSCULAR | Status: AC
  Filled 2018-03-17: qty 1

## 2018-03-17 MED ORDER — METOCLOPRAMIDE HCL 5 MG PO TABS: 5.0000 mg | ORAL_TABLET | Freq: Three times a day (TID) | ORAL | Status: DC | PRN

## 2018-03-17 MED ORDER — HYDROMORPHONE HCL 1 MG/ML IJ SOLN: 0.2500 mg | INTRAMUSCULAR | Status: DC | PRN

## 2018-03-17 MED ORDER — ONDANSETRON HCL 4 MG/2ML IJ SOLN: 4.0000 mg | Freq: Four times a day (QID) | INTRAMUSCULAR | Status: DC | PRN

## 2018-03-17 MED ORDER — RIVAROXABAN 10 MG PO TABS
10.0000 mg | ORAL_TABLET | Freq: Every day | ORAL | Status: DC
  Administered 2018-03-18 – 2018-03-20 (×3): 10 mg via ORAL
  Filled 2018-03-17 (×3): qty 1

## 2018-03-17 MED ORDER — PROPOFOL 10 MG/ML IV BOLUS
INTRAVENOUS | Status: AC
  Filled 2018-03-17: qty 60

## 2018-03-17 MED ORDER — MEPERIDINE HCL 50 MG/ML IJ SOLN: 6.2500 mg | INTRAMUSCULAR | Status: DC | PRN

## 2018-03-17 MED ORDER — PHENOL 1.4 % MT LIQD: 1.0000 | OROMUCOSAL | Status: DC | PRN

## 2018-03-17 MED ORDER — 0.9 % SODIUM CHLORIDE (POUR BTL) OPTIME
TOPICAL | Status: DC | PRN
  Administered 2018-03-17: 1000 mL

## 2018-03-17 MED ORDER — METHOCARBAMOL 500 MG IVPB - SIMPLE MED
500.0000 mg | Freq: Four times a day (QID) | INTRAVENOUS | Status: DC | PRN
  Administered 2018-03-17: 500 mg via INTRAVENOUS
  Filled 2018-03-17: qty 50

## 2018-03-17 MED ORDER — FENTANYL CITRATE (PF) 100 MCG/2ML IJ SOLN
INTRAMUSCULAR | Status: AC
  Filled 2018-03-17: qty 2

## 2018-03-17 MED ORDER — FENTANYL CITRATE (PF) 250 MCG/5ML IJ SOLN
INTRAMUSCULAR | Status: DC | PRN
  Administered 2018-03-17 (×7): 50 ug via INTRAVENOUS

## 2018-03-17 MED ORDER — GABAPENTIN 300 MG PO CAPS
300.0000 mg | ORAL_CAPSULE | Freq: Once | ORAL | Status: AC
  Administered 2018-03-17: 300 mg via ORAL

## 2018-03-17 MED ORDER — DEXAMETHASONE SODIUM PHOSPHATE 10 MG/ML IJ SOLN
INTRAMUSCULAR | Status: AC
  Filled 2018-03-17: qty 1

## 2018-03-17 MED ORDER — DEXMEDETOMIDINE HCL 200 MCG/2ML IV SOLN
INTRAVENOUS | Status: DC | PRN
  Administered 2018-03-17: 36 ug via INTRAVENOUS

## 2018-03-17 MED ORDER — HYDROMORPHONE HCL 1 MG/ML IJ SOLN
1.0000 mg | INTRAMUSCULAR | Status: DC | PRN
  Administered 2018-03-17 – 2018-03-19 (×9): 1 mg via INTRAVENOUS
  Filled 2018-03-17 (×10): qty 1

## 2018-03-17 MED ORDER — METHOCARBAMOL 500 MG PO TABS
500.0000 mg | ORAL_TABLET | Freq: Four times a day (QID) | ORAL | Status: DC | PRN
  Administered 2018-03-17 – 2018-03-18 (×3): 500 mg via ORAL
  Filled 2018-03-17 (×3): qty 1

## 2018-03-17 MED ORDER — CEFAZOLIN SODIUM-DEXTROSE 2-4 GM/100ML-% IV SOLN
INTRAVENOUS | Status: AC
  Filled 2018-03-17: qty 100

## 2018-03-17 MED ORDER — TRANEXAMIC ACID-NACL 1000-0.7 MG/100ML-% IV SOLN
INTRAVENOUS | Status: AC
  Filled 2018-03-17: qty 100

## 2018-03-17 MED ORDER — PROPOFOL 10 MG/ML IV BOLUS
INTRAVENOUS | Status: AC
  Filled 2018-03-17: qty 40

## 2018-03-17 MED ORDER — METHOCARBAMOL 500 MG IVPB - SIMPLE MED
INTRAVENOUS | Status: AC
  Administered 2018-03-17: 500 mg via INTRAVENOUS
  Filled 2018-03-17: qty 50

## 2018-03-17 MED ORDER — DEXMEDETOMIDINE HCL IN NACL 200 MCG/50ML IV SOLN
INTRAVENOUS | Status: AC
  Filled 2018-03-17: qty 50

## 2018-03-17 MED ORDER — HYDROMORPHONE HCL 1 MG/ML IJ SOLN
INTRAMUSCULAR | Status: DC | PRN
  Administered 2018-03-17 (×5): .4 mg via INTRAVENOUS

## 2018-03-17 MED ORDER — PROMETHAZINE HCL 25 MG/ML IJ SOLN: 6.2500 mg | INTRAMUSCULAR | Status: DC | PRN

## 2018-03-17 MED ORDER — METOCLOPRAMIDE HCL 5 MG/ML IJ SOLN: 5.0000 mg | Freq: Three times a day (TID) | INTRAMUSCULAR | Status: DC | PRN

## 2018-03-17 MED ORDER — MIDAZOLAM HCL 2 MG/2ML IJ SOLN
INTRAMUSCULAR | Status: AC
  Filled 2018-03-17: qty 2

## 2018-03-17 MED ORDER — FENTANYL CITRATE (PF) 250 MCG/5ML IJ SOLN
INTRAMUSCULAR | Status: AC
  Filled 2018-03-17: qty 5

## 2018-03-17 MED ORDER — TRAZODONE HCL 50 MG PO TABS
50.0000 mg | ORAL_TABLET | Freq: Every day | ORAL | Status: DC
  Administered 2018-03-17 – 2018-03-19 (×3): 50 mg via ORAL
  Filled 2018-03-17 (×3): qty 1

## 2018-03-17 MED ORDER — KETAMINE HCL 10 MG/ML IJ SOLN
INTRAMUSCULAR | Status: AC
  Filled 2018-03-17: qty 1

## 2018-03-17 MED ORDER — BUPROPION HCL ER (SR) 150 MG PO TB12
150.0000 mg | ORAL_TABLET | Freq: Every day | ORAL | Status: DC
  Administered 2018-03-18 – 2018-03-20 (×3): 150 mg via ORAL
  Filled 2018-03-17 (×3): qty 1

## 2018-03-17 MED ORDER — GABAPENTIN 300 MG PO CAPS
ORAL_CAPSULE | ORAL | Status: AC
  Administered 2018-03-17: 300 mg via ORAL
  Filled 2018-03-17: qty 1

## 2018-03-17 MED ORDER — HYDROMORPHONE HCL 1 MG/ML IJ SOLN
INTRAMUSCULAR | Status: AC
  Administered 2018-03-17: 0.5 mg via INTRAVENOUS
  Filled 2018-03-17: qty 1

## 2018-03-17 MED ORDER — DEXMEDETOMIDINE HCL 200 MCG/2ML IV SOLN
16.0000 ug | Freq: Once | INTRAVENOUS | Status: AC
  Filled 2018-03-17: qty 0.16

## 2018-03-17 MED ORDER — HYOSCYAMINE SULFATE 0.125 MG SL SUBL
0.1250 mg | SUBLINGUAL_TABLET | Freq: Four times a day (QID) | SUBLINGUAL | Status: DC | PRN
  Filled 2018-03-17: qty 1

## 2018-03-17 MED ORDER — CHLORHEXIDINE GLUCONATE 4 % EX LIQD: 60.0000 mL | Freq: Once | CUTANEOUS | Status: DC

## 2018-03-17 MED ORDER — TRANEXAMIC ACID-NACL 1000-0.7 MG/100ML-% IV SOLN
1000.0000 mg | Freq: Once | INTRAVENOUS | Status: AC
  Administered 2018-03-17: 1000 mg via INTRAVENOUS
  Filled 2018-03-17: qty 100

## 2018-03-17 MED ORDER — ALPRAZOLAM 0.5 MG PO TABS
0.5000 mg | ORAL_TABLET | Freq: Every evening | ORAL | Status: DC | PRN
  Filled 2018-03-17: qty 1

## 2018-03-17 MED ORDER — ACETAMINOPHEN 500 MG PO TABS
1000.0000 mg | ORAL_TABLET | Freq: Four times a day (QID) | ORAL | Status: AC
  Administered 2018-03-17 – 2018-03-18 (×4): 1000 mg via ORAL
  Filled 2018-03-17 (×4): qty 2

## 2018-03-17 MED ORDER — ROPIVACAINE HCL 5 MG/ML IJ SOLN: INTRAMUSCULAR | Status: DC | PRN

## 2018-03-17 MED ORDER — MIDAZOLAM HCL 2 MG/2ML IJ SOLN
INTRAMUSCULAR | Status: DC | PRN
  Administered 2018-03-17: 2 mg via INTRAVENOUS

## 2018-03-17 MED ORDER — TRANEXAMIC ACID-NACL 1000-0.7 MG/100ML-% IV SOLN
1000.0000 mg | INTRAVENOUS | Status: AC
  Administered 2018-03-17: 1000 mg via INTRAVENOUS

## 2018-03-17 MED ORDER — SODIUM CHLORIDE (PF) 0.9 % IJ SOLN
INTRAMUSCULAR | Status: AC
  Filled 2018-03-17: qty 10

## 2018-03-17 MED ORDER — ONDANSETRON HCL 4 MG/2ML IJ SOLN
INTRAMUSCULAR | Status: DC | PRN
  Administered 2018-03-17: 4 mg via INTRAVENOUS

## 2018-03-17 MED ORDER — STERILE WATER FOR IRRIGATION IR SOLN
Status: DC | PRN
  Administered 2018-03-17: 2000 mL

## 2018-03-17 MED ORDER — HYDROCODONE-ACETAMINOPHEN 7.5-325 MG PO TABS: 1.0000 | ORAL_TABLET | Freq: Once | ORAL | Status: DC | PRN

## 2018-03-17 MED ORDER — CEFAZOLIN SODIUM-DEXTROSE 1-4 GM/50ML-% IV SOLN
1.0000 g | Freq: Four times a day (QID) | INTRAVENOUS | Status: AC
  Administered 2018-03-17 (×2): 1 g via INTRAVENOUS
  Filled 2018-03-17: qty 50

## 2018-03-17 MED ORDER — DEXAMETHASONE SODIUM PHOSPHATE 10 MG/ML IJ SOLN
8.0000 mg | Freq: Once | INTRAMUSCULAR | Status: AC
  Administered 2018-03-17: 10 mg via INTRAVENOUS

## 2018-03-17 MED ORDER — DIPHENHYDRAMINE HCL 12.5 MG/5ML PO ELIX: 12.5000 mg | ORAL_SOLUTION | ORAL | Status: DC | PRN

## 2018-03-17 MED ORDER — BISACODYL 10 MG RE SUPP: 10.0000 mg | Freq: Every day | RECTAL | Status: DC | PRN

## 2018-03-17 MED ORDER — SCOPOLAMINE 1 MG/3DAYS TD PT72
MEDICATED_PATCH | TRANSDERMAL | Status: AC
  Filled 2018-03-17: qty 1

## 2018-03-17 MED ORDER — MONTELUKAST SODIUM 10 MG PO TABS
10.0000 mg | ORAL_TABLET | Freq: Every day | ORAL | Status: DC
  Administered 2018-03-17 – 2018-03-19 (×3): 10 mg via ORAL
  Filled 2018-03-17 (×3): qty 1

## 2018-03-17 MED ORDER — HYDROMORPHONE HCL 2 MG PO TABS
4.0000 mg | ORAL_TABLET | ORAL | Status: DC | PRN
  Administered 2018-03-17 (×2): 4 mg via ORAL
  Administered 2018-03-17 – 2018-03-20 (×12): 6 mg via ORAL
  Filled 2018-03-17: qty 3
  Filled 2018-03-17: qty 2
  Filled 2018-03-17 (×11): qty 3

## 2018-03-17 MED ORDER — LIDOCAINE 2% (20 MG/ML) 5 ML SYRINGE
INTRAMUSCULAR | Status: AC
  Filled 2018-03-17: qty 5

## 2018-03-17 MED ORDER — FLUOXETINE HCL 20 MG PO CAPS
20.0000 mg | ORAL_CAPSULE | Freq: Every day | ORAL | Status: DC
  Administered 2018-03-18 – 2018-03-20 (×3): 20 mg via ORAL
  Filled 2018-03-17 (×3): qty 1

## 2018-03-17 MED ORDER — DEXMEDETOMIDINE HCL 200 MCG/2ML IV SOLN: 26.0000 ug | Freq: Once | INTRAVENOUS | Status: AC

## 2018-03-17 MED ORDER — ACETAMINOPHEN 10 MG/ML IV SOLN: 1000.0000 mg | Freq: Once | INTRAVENOUS | Status: DC | PRN

## 2018-03-17 MED ORDER — ACETAMINOPHEN 10 MG/ML IV SOLN
INTRAVENOUS | Status: AC
  Filled 2018-03-17: qty 100

## 2018-03-17 MED ORDER — SODIUM CHLORIDE 0.9 % IV SOLN
INTRAVENOUS | Status: DC
  Administered 2018-03-17: 14:00:00 via INTRAVENOUS

## 2018-03-17 MED ORDER — CEFAZOLIN SODIUM-DEXTROSE 2-4 GM/100ML-% IV SOLN
2.0000 g | INTRAVENOUS | Status: AC
  Administered 2018-03-17: 2 g via INTRAVENOUS

## 2018-03-17 MED ORDER — MENTHOL 3 MG MT LOZG: 1.0000 | LOZENGE | OROMUCOSAL | Status: DC | PRN

## 2018-03-17 MED ORDER — HYOSCYAMINE SULFATE 0.125 MG PO TABS: 0.1250 mg | ORAL_TABLET | Freq: Four times a day (QID) | ORAL | Status: DC | PRN

## 2018-03-17 MED ORDER — DIPHENHYDRAMINE HCL 25 MG PO CAPS
25.0000 mg | ORAL_CAPSULE | Freq: Every day | ORAL | Status: DC
  Administered 2018-03-17 – 2018-03-19 (×3): 25 mg via ORAL
  Filled 2018-03-17 (×3): qty 1

## 2018-03-17 MED ORDER — PROPOFOL 10 MG/ML IV BOLUS
INTRAVENOUS | Status: DC | PRN
  Administered 2018-03-17: 200 mg via INTRAVENOUS

## 2018-03-17 MED ORDER — GABAPENTIN 300 MG PO CAPS
600.0000 mg | ORAL_CAPSULE | Freq: Every day | ORAL | Status: DC
  Administered 2018-03-17 – 2018-03-19 (×3): 600 mg via ORAL
  Filled 2018-03-17 (×3): qty 2

## 2018-03-17 MED ORDER — HYDROMORPHONE HCL 2 MG PO TABS
2.0000 mg | ORAL_TABLET | ORAL | Status: DC | PRN
  Administered 2018-03-17 (×2): 2 mg via ORAL
  Administered 2018-03-18 – 2018-03-19 (×3): 4 mg via ORAL
  Filled 2018-03-17: qty 2
  Filled 2018-03-17: qty 1
  Filled 2018-03-17 (×2): qty 2
  Filled 2018-03-17 (×2): qty 1
  Filled 2018-03-17: qty 2

## 2018-03-17 MED ORDER — FLEET ENEMA 7-19 GM/118ML RE ENEM: 1.0000 | ENEMA | Freq: Once | RECTAL | Status: DC | PRN

## 2018-03-17 MED ORDER — LACTATED RINGERS IV SOLN
INTRAVENOUS | Status: DC
  Administered 2018-03-17 (×2): via INTRAVENOUS

## 2018-03-17 MED ORDER — HYDROMORPHONE HCL 1 MG/ML IJ SOLN
0.2500 mg | INTRAMUSCULAR | Status: DC | PRN
  Administered 2018-03-17 (×6): 0.5 mg via INTRAVENOUS

## 2018-03-17 MED ORDER — POLYETHYLENE GLYCOL 3350 17 G PO PACK: 17.0000 g | PACK | Freq: Every day | ORAL | Status: DC | PRN

## 2018-03-17 MED ORDER — DOCUSATE SODIUM 100 MG PO CAPS
100.0000 mg | ORAL_CAPSULE | Freq: Two times a day (BID) | ORAL | Status: DC
  Administered 2018-03-17 – 2018-03-20 (×6): 100 mg via ORAL
  Filled 2018-03-17 (×6): qty 1

## 2018-03-17 MED ORDER — ONDANSETRON HCL 4 MG PO TABS: 4.0000 mg | ORAL_TABLET | Freq: Four times a day (QID) | ORAL | Status: DC | PRN

## 2018-03-17 MED ORDER — SCOPOLAMINE 1 MG/3DAYS TD PT72
1.0000 | MEDICATED_PATCH | TRANSDERMAL | Status: DC
  Administered 2018-03-17: 1.5 mg via TRANSDERMAL
  Filled 2018-03-17: qty 1

## 2018-03-17 MED ORDER — ROPIVACAINE HCL 5 MG/ML IJ SOLN
INTRAMUSCULAR | Status: DC | PRN
  Administered 2018-03-17: 30 mL via PERINEURAL

## 2018-03-17 MED ORDER — ACETAMINOPHEN 10 MG/ML IV SOLN
1000.0000 mg | Freq: Four times a day (QID) | INTRAVENOUS | Status: DC
  Administered 2018-03-17: 1000 mg via INTRAVENOUS

## 2018-03-17 MED ORDER — SODIUM CHLORIDE (PF) 0.9 % IJ SOLN
INTRAMUSCULAR | Status: AC
  Filled 2018-03-17: qty 50

## 2018-03-17 SURGICAL SUPPLY — 70 items
ADAPTER BOLT FEMORAL +2/-2 (Knees) ×2 IMPLANT
AUGMENT DIST PFC 4MM (Knees) ×2 IMPLANT
BAG DECANTER FOR FLEXI CONT (MISCELLANEOUS) ×2 IMPLANT
BAG ZIPLOCK 12X15 (MISCELLANEOUS) IMPLANT
BANDAGE ACE 6X5 VEL STRL LF (GAUZE/BANDAGES/DRESSINGS) ×2 IMPLANT
BLADE SAG 18X100X1.27 (BLADE) ×2 IMPLANT
BLADE SAW SGTL 11.0X1.19X90.0M (BLADE) ×2 IMPLANT
BNDG ELASTIC 6X10 VLCR STRL LF (GAUZE/BANDAGES/DRESSINGS) ×2 IMPLANT
BONE CEMENT GENTAMICIN (Cement) ×6 IMPLANT
CEMENT BONE GENTAMICIN 40 (Cement) ×3 IMPLANT
CEMENT RESTRICTOR DEPUY SZ 4 (Cement) ×2 IMPLANT
CLOTH BEACON ORANGE TIMEOUT ST (SAFETY) ×2 IMPLANT
COVER SURGICAL LIGHT HANDLE (MISCELLANEOUS) ×2 IMPLANT
COVER WAND RF STERILE (DRAPES) ×2 IMPLANT
CUFF TOURN SGL QUICK 34 (TOURNIQUET CUFF) ×1
CUFF TRNQT CYL 34X4X40X1 (TOURNIQUET CUFF) ×1 IMPLANT
DECANTER SPIKE VIAL GLASS SM (MISCELLANEOUS) IMPLANT
DISAL AUG PFC 4MM (Knees) ×4 IMPLANT
DRAPE U-SHAPE 47X51 STRL (DRAPES) ×2 IMPLANT
DRSG ADAPTIC 3X8 NADH LF (GAUZE/BANDAGES/DRESSINGS) ×2 IMPLANT
DRSG PAD ABDOMINAL 8X10 ST (GAUZE/BANDAGES/DRESSINGS) ×2 IMPLANT
DURAPREP 26ML APPLICATOR (WOUND CARE) ×2 IMPLANT
ELECT REM PT RETURN 15FT ADLT (MISCELLANEOUS) ×2 IMPLANT
EVACUATOR 1/8 PVC DRAIN (DRAIN) ×2 IMPLANT
FEMORAL ADAPTER (Orthopedic Implant) ×2 IMPLANT
FEMORAL PFC TC3 (Orthopedic Implant) ×2 IMPLANT
GAUZE SPONGE 4X4 12PLY STRL (GAUZE/BANDAGES/DRESSINGS) ×2 IMPLANT
GLOVE BIO SURGEON STRL SZ7 (GLOVE) ×2 IMPLANT
GLOVE BIO SURGEON STRL SZ8 (GLOVE) ×2 IMPLANT
GLOVE BIOGEL PI IND STRL 7.0 (GLOVE) ×1 IMPLANT
GLOVE BIOGEL PI IND STRL 8 (GLOVE) ×1 IMPLANT
GLOVE BIOGEL PI INDICATOR 7.0 (GLOVE) ×1
GLOVE BIOGEL PI INDICATOR 8 (GLOVE) ×1
GOWN STRL REUS W/TWL LRG LVL3 (GOWN DISPOSABLE) ×2 IMPLANT
GOWN STRL REUS W/TWL XL LVL3 (GOWN DISPOSABLE) ×2 IMPLANT
HANDPIECE INTERPULSE COAX TIP (DISPOSABLE) ×1
HOLDER FOLEY CATH W/STRAP (MISCELLANEOUS) ×2 IMPLANT
IMMOBILIZER KNEE 20 (SOFTGOODS) ×2
IMMOBILIZER KNEE 20 THIGH 36 (SOFTGOODS) ×1 IMPLANT
INSERT TC3 RP TIB  2.5 17.5MM (Knees) ×1 IMPLANT
INSERT TC3 RP TIB 2.5 17.5MM (Knees) ×1 IMPLANT
MANIFOLD NEPTUNE II (INSTRUMENTS) ×2 IMPLANT
NS IRRIG 1000ML POUR BTL (IV SOLUTION) ×2 IMPLANT
PACK TOTAL KNEE CUSTOM (KITS) ×2 IMPLANT
PADDING CAST COTTON 6X4 STRL (CAST SUPPLIES) ×4 IMPLANT
PADDING CAST SYN 6 (CAST SUPPLIES) ×1
PADDING CAST SYNTHETIC 6X4 NS (CAST SUPPLIES) ×1 IMPLANT
PIN STEINMAN FIXATION KNEE (PIN) ×2 IMPLANT
POSITIONER SURGICAL ARM (MISCELLANEOUS) ×2 IMPLANT
POST AUG PFC 4MM SZ 2.5 (Knees) ×4 IMPLANT
SET HNDPC FAN SPRY TIP SCT (DISPOSABLE) ×1 IMPLANT
STEM TIBIA PFC 13X30MM (Stem) ×2 IMPLANT
STEM UNIVERSAL REVISION 75X16 (Stem) ×2 IMPLANT
STRIP CLOSURE SKIN 1/2X4 (GAUZE/BANDAGES/DRESSINGS) ×2 IMPLANT
SUT MNCRL AB 4-0 PS2 18 (SUTURE) ×2 IMPLANT
SUT STRATAFIX 0 PDS 27 VIOLET (SUTURE) ×2
SUT VIC AB 2-0 CT1 27 (SUTURE) ×3
SUT VIC AB 2-0 CT1 TAPERPNT 27 (SUTURE) ×3 IMPLANT
SUTURE STRATFX 0 PDS 27 VIOLET (SUTURE) ×1 IMPLANT
SWAB COLLECTION DEVICE MRSA (MISCELLANEOUS) IMPLANT
SWAB CULTURE ESWAB REG 1ML (MISCELLANEOUS) IMPLANT
SYR 50ML LL SCALE MARK (SYRINGE) ×4 IMPLANT
TOWER CARTRIDGE SMART MIX (DISPOSABLE) ×2 IMPLANT
TRAY FOLEY MTR SLVR 16FR STAT (SET/KITS/TRAYS/PACK) ×2 IMPLANT
TRAY REVISION SZ 2.5 (Knees) ×2 IMPLANT
TRAY SLEEVE CEM ML (Knees) ×2 IMPLANT
TUBE KAMVAC SUCTION (TUBING) IMPLANT
WATER STERILE IRR 1000ML POUR (IV SOLUTION) ×2 IMPLANT
WEDGE STEP 2.5 10MM (Knees) ×4 IMPLANT
WRAP KNEE MAXI GEL POST OP (GAUZE/BANDAGES/DRESSINGS) ×2 IMPLANT

## 2018-03-17 NOTE — Anesthesia Postprocedure Evaluation (Signed)
Anesthesia Post Note  Patient: Felicia Gordon  Procedure(s) Performed: LEFT TOTAL KNEE ARTHROPLASTY REVISION (Left Knee)     Patient location during evaluation: PACU Anesthesia Type: General Level of consciousness: awake and alert Pain management: pain level controlled Vital Signs Assessment: post-procedure vital signs reviewed and stable Respiratory status: spontaneous breathing, nonlabored ventilation, respiratory function stable and patient connected to nasal cannula oxygen Cardiovascular status: blood pressure returned to baseline and stable Postop Assessment: no apparent nausea or vomiting Anesthetic complications: no    Last Vitals:  Vitals:   03/17/18 1200 03/17/18 1219  BP: 112/72 118/77  Pulse: 86 87  Resp: 15 16  Temp:    SpO2: 99% 93%    Last Pain:  Vitals:   03/17/18 1200  TempSrc:   PainSc: Asleep                 Trevor IhaStephen A Katelynn Heidler

## 2018-03-17 NOTE — Discharge Instructions (Addendum)
° °Dr. Frank Aluisio °Total Joint Specialist °[facility] °[address] °[place], [place] [address] °[phone] ° °TOTAL KNEE REVISION POSTOPERATIVE DIRECTIONS ° °Knee Rehabilitation, Guidelines Following Surgery  °Results after knee surgery are often greatly improved when you follow the exercise, range of motion and muscle strengthening exercises prescribed by your doctor. Safety measures are also important to protect the knee from further injury. Any time any of these exercises cause you to have increased pain or swelling in your knee joint, decrease the amount until you are comfortable again and slowly increase them. If you have problems or questions, call your caregiver or physical therapist for advice.  ° °HOME CARE INSTRUCTIONS  °• Remove items at home which could result in a fall. This includes throw rugs or furniture in walking pathways.  °· ICE to the affected knee every three hours for 30 minutes at a time and then as needed for pain and swelling.  Continue to use ice on the knee for pain and swelling from surgery. You may notice swelling that will progress down to the foot and ankle.  This is normal after surgery.  Elevate the leg when you are not up walking on it.   °· Continue to use the breathing machine which will help keep your temperature down.  It is common for your temperature to cycle up and down following surgery, especially at night when you are not up moving around and exerting yourself.  The breathing machine keeps your lungs expanded and your temperature down. °· Do not place pillow under knee, focus on keeping the knee straight while resting ° °DIET °You may resume your previous home diet once your are discharged from the hospital. ° °DRESSING / WOUND CARE / SHOWERING °You may shower 3 days after surgery, but keep the wounds dry during showering.  You may use an occlusive plastic wrap (Press'n Seal for example), NO SOAKING/SUBMERGING IN THE BATHTUB.  If the bandage gets  wet, change with a clean dry gauze.  If the incision gets wet, pat the wound dry with a clean towel. °You may start showering once you are discharged home but do not submerge the incision under water. Just pat the incision dry and apply a dry gauze dressing on daily. °Change the surgical dressing daily and reapply a dry dressing each time. ° °ACTIVITY °Walk with your walker as instructed. °Use walker as long as suggested by your caregivers. °Avoid periods of inactivity such as sitting longer than an hour when not asleep. This helps prevent blood clots.  °You may resume a sexual relationship in one month or when given the OK by your doctor.  °You may return to work once you are cleared by your doctor.  °Do not drive a car for 6 weeks or until released by you surgeon.  °Do not drive while taking narcotics. ° °WEIGHT BEARING °Weight bearing as tolerated with assist device (walker, cane, etc) as directed, use it as long as suggested by your surgeon or therapist, typically at least 4-6 weeks. ° °POSTOPERATIVE CONSTIPATION PROTOCOL °Constipation - defined medically as fewer than three stools per week and severe constipation as less than one stool per week. ° °One of the most common issues patients have following surgery is constipation.  Even if you have a regular bowel pattern at home, your normal regimen is likely to be disrupted due to multiple reasons following surgery.  Combination of anesthesia, postoperative narcotics, change in appetite and fluid intake all can affect your bowels.    In order to avoid complications following surgery, here are some recommendations in order to help you during your recovery period. ° °Colace (docusate) - Pick up an over-the-counter form of Colace or another stool softener and take twice a day as long as you are requiring postoperative pain medications.  Take with a full glass of water daily.  If you experience loose stools or diarrhea, hold the colace until you stool forms back up.  If  your symptoms do not get better within 1 week or if they get worse, check with your doctor. ° °Dulcolax (bisacodyl) - Pick up over-the-counter and take as directed by the product packaging as needed to assist with the movement of your bowels.  Take with a full glass of water.  Use this product as needed if not relieved by Colace only.  ° °MiraLax (polyethylene glycol) - Pick up over-the-counter to have on hand.  MiraLax is a solution that will increase the amount of water in your bowels to assist with bowel movements.  Take as directed and can mix with a glass of water, juice, soda, coffee, or tea.  Take if you go more than two days without a movement. °Do not use MiraLax more than once per day. Call your doctor if you are still constipated or irregular after using this medication for 7 days in a row. ° °If you continue to have problems with postoperative constipation, please contact the office for further assistance and recommendations.  If you experience "the worst abdominal pain ever" or develop nausea or vomiting, please contact the office immediatly for further recommendations for treatment. ° °ITCHING ° If you experience itching with your medications, try taking only a single pain pill, or even half a pain pill at a time.  You can also use Benadryl over the counter for itching or also to help with sleep.  ° °TED HOSE STOCKINGS °Wear the elastic stockings on both legs for three weeks following surgery during the day but you may remove then at night for sleeping. ° °MEDICATIONS °See your medication summary on the “After Visit Summary” that the nursing staff will review with you prior to discharge.  You may have some home medications which will be placed on hold until you complete the course of blood thinner medication.  It is important for you to complete the blood thinner medication as prescribed by your surgeon.  Continue your approved medications as instructed at time of discharge. ° °PRECAUTIONS °If you  experience chest pain or shortness of breath - call 911 immediately for transfer to the hospital emergency department.  °If you develop a fever greater that 101 F, purulent drainage from wound, increased redness or drainage from wound, foul odor from the wound/dressing, or calf pain - CONTACT YOUR SURGEON.   °                                                °FOLLOW-UP APPOINTMENTS °Make sure you keep all of your appointments after your operation with your surgeon and caregivers. You should call the office at the above phone number and make an appointment for approximately two weeks after the date of your surgery or on the date instructed by your surgeon outlined in the "After Visit Summary". ° ° °RANGE OF MOTION AND STRENGTHENING EXERCISES  °Rehabilitation of the knee is important following a knee injury or   an operation. After just a few days of immobilization, the muscles of the thigh which control the knee become weakened and shrink (atrophy). Knee exercises are designed to build up the tone and strength of the thigh muscles and to improve knee motion. Often times heat used for twenty to thirty minutes before working out will loosen up your tissues and help with improving the range of motion but do not use heat for the first two weeks following surgery. These exercises can be done on a training (exercise) mat, on the floor, on a table or on a bed. Use what ever works the best and is most comfortable for you Knee exercises include:   Leg Lifts - While your knee is still immobilized in a splint or cast, you can do straight leg raises. Lift the leg to 60 degrees, hold for 3 sec, and slowly lower the leg. Repeat 10-20 times 2-3 times daily. Perform this exercise against resistance later as your knee gets better.   Quad and Hamstring Sets - Tighten up the muscle on the front of the thigh (Quad) and hold for 5-10 sec. Repeat this 10-20 times hourly. Hamstring sets are done by pushing the foot backward against an  object and holding for 5-10 sec. Repeat as with quad sets.   Leg Slides: Lying on your back, slowly slide your foot toward your buttocks, bending your knee up off the floor (only go as far as is comfortable). Then slowly slide your foot back down until your leg is flat on the floor again.  Angel Wings: Lying on your back spread your legs to the side as far apart as you can without causing discomfort.  A rehabilitation program following serious knee injuries can speed recovery and prevent re-injury in the future due to weakened muscles. Contact your doctor or a physical therapist for more information on knee rehabilitation.   IF YOU ARE TRANSFERRED TO A SKILLED REHAB FACILITY If the patient is transferred to a skilled rehab facility following release from the hospital, a list of the current medications will be sent to the facility for the patient to continue.  When discharged from the skilled rehab facility, please have the facility set up the patient's Home Health Physical Therapy prior to being released. Also, the skilled facility will be responsible for providing the patient with their medications at time of release from the facility to include their pain medication, the muscle relaxants, and their blood thinner medication. If the patient is still at the rehab facility at time of the two week follow up appointment, the skilled rehab facility will also need to assist the patient in arranging follow up appointment in our office and any transportation needs.  MAKE SURE YOU:   Understand these instructions.   Get help right away if you are not doing well or get worse.    Pick up stool softner and laxative for home use following surgery while on pain medications. Do not submerge incision under water. Please use good hand washing techniques while changing dressing each day. May shower starting three days after surgery. Please use a clean towel to pat the incision dry following showers. Continue to  use ice for pain and swelling after surgery. Do not use any lotions or creams on the incision until instructed by your surgeon.   Information on my medicine - XARELTO (Rivaroxaban)  This medication education was reviewed with me or my healthcare representative as part of my discharge preparation.  The pharmacist that spoke with  me during my hospital stay was:    Why was Xarelto prescribed for you? Xarelto was prescribed for you to reduce the risk of blood clots forming after orthopedic surgery. The medical term for these abnormal blood clots is venous thromboembolism (VTE).  What do you need to know about xarelto ? Take your Xarelto ONCE DAILY at the same time every day. You may take it either with or without food.  If you have difficulty swallowing the tablet whole, you may crush it and mix in applesauce just prior to taking your dose.  Take Xarelto exactly as prescribed by your doctor and DO NOT stop taking Xarelto without talking to the doctor who prescribed the medication.  Stopping without other VTE prevention medication to take the place of Xarelto may increase your risk of developing a clot.  After discharge, you should have regular check-up appointments with your healthcare provider that is prescribing your Xarelto.    What do you do if you miss a dose? If you miss a dose, take it as soon as you remember on the same day then continue your regularly scheduled once daily regimen the next day. Do not take two doses of Xarelto on the same day.   Important Safety Information A possible side effect of Xarelto is bleeding. You should call your healthcare provider right away if you experience any of the following: ? Bleeding from an injury or your nose that does not stop. ? Unusual colored urine (red or dark brown) or unusual colored stools (red or black). ? Unusual bruising for unknown reasons. ? A serious fall or if you hit your head (even if there is no bleeding).  Some  medicines may interact with Xarelto and might increase your risk of bleeding while on Xarelto. To help avoid this, consult your healthcare provider or pharmacist prior to using any new prescription or non-prescription medications, including herbals, vitamins, non-steroidal anti-inflammatory drugs (NSAIDs) and supplements.  This website has more information on Xarelto: [email or web address].br.

## 2018-03-17 NOTE — Evaluation (Signed)
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 Pt ambulated 20' with RW, distance limited by pain. Initiated HEP. Good progress expected. Pt will benefit from skilled PT to increase their independence and safety with mobility to allow discharge to the venue listed below.      Follow Up Recommendations Outpatient PT;Follow surgeon's recommendation for DC plan and follow-up therapies    Equipment Recommendations  None recommended by PT    Recommendations for Other Services       Precautions / Restrictions Precautions Precautions: Knee Precaution Comments: reviewed no pillow under knee Restrictions Weight Bearing Restrictions: No Other Position/Activity Restrictions: WBAT      Mobility  Bed Mobility Overal bed mobility: Needs Assistance Bed Mobility: Supine to Sit     Supine to sit: Min guard     General bed mobility comments: min guard for LLE, self assisted LLE with RLE  Transfers Overall transfer level: Needs assistance Equipment used: Rolling walker (2 wheeled) Transfers: Sit to/from Stand Sit to Stand: Min guard         General transfer comment: VCs hand placement  Ambulation/Gait Ambulation/Gait assistance: Min guard Gait Distance (Feet): 20 Feet Assistive device: Rolling walker (2 wheeled) Gait Pattern/deviations: Step-to pattern;Decreased weight shift to left;Decreased stride length Gait velocity: decr   General Gait Details: good sequencing, distance limited by pain  Stairs            Wheelchair Mobility    Modified Rankin (Stroke Patients Only)       Balance Overall balance assessment: Modified Independent                                           Pertinent Vitals/Pain Pain  Assessment: 0-10 Pain Score: 6  Pain Location: L knee Pain Descriptors / Indicators: Sore Pain Intervention(s): Limited activity within patient's tolerance;Monitored during session;Premedicated before session;Ice applied    Home Living Family/patient expects to be discharged to:: Private residence Living Arrangements: Alone Available Help at Discharge: Family   Home Access: Level entry     Home Layout: One level Home Equipment: Environmental consultant - 2 wheels;Crutches Additional Comments: will DC to daughter's apartment where there are no stairs, daughter is PA at [facility]    Prior Function Level of Independence: Independent         Comments: walked short distances without AD     Hand Dominance        Extremity/Trunk Assessment   Upper Extremity Assessment Upper Extremity Assessment: Overall WFL for tasks assessed    Lower Extremity Assessment Lower Extremity Assessment: LLE deficits/detail LLE Deficits / Details: 5-35* AAROM L knee, +2/5 SLR LLE Sensation: WNL       Communication   Communication: No difficulties  Cognition Arousal/Alertness: Awake/alert Behavior During Therapy: WFL for tasks assessed/performed Overall Cognitive Status: Within Functional Limits for tasks assessed                                        General Comments      Exercises Total Joint Exercises Ankle Circles/Pumps: AROM;Both;10 reps;Supine Quad Sets: AROM;Both;10 reps;Supine  Heel Slides: AAROM;Left;10 reps;Supine Goniometric ROM: 5-35* AAROM L knee   Assessment/Plan    PT Assessment Patient needs continued PT services  PT Problem List Decreased range of motion;Decreased strength;Decreased activity tolerance;Decreased mobility;Pain       PT Treatment Interventions Gait training;DME instruction;Functional mobility training;Therapeutic activities;Therapeutic exercise;Patient/family education    PT Goals (Current goals can be found in the Care Plan section)  Acute  Rehab PT Goals Patient Stated Goal: return to work at [employer], play with dogs PT Goal Formulation: With patient/family Time For Goal Achievement: 03/24/18 Potential to Achieve Goals: Good    Frequency 7X/week   Barriers to discharge        Co-evaluation               AM-PAC PT "6 Clicks" Daily Activity  Outcome Measure Difficulty turning over in bed (including adjusting bedclothes, sheets and blankets)?: A Little Difficulty moving from lying on back to sitting on the side of the bed? : A Little Difficulty sitting down on and standing up from a chair with arms (e.g., wheelchair, bedside commode, etc,.)?: A Little Help needed moving to and from a bed to chair (including a wheelchair)?: A Little Help needed walking in hospital room?: A Little Help needed climbing 3-5 steps with a railing? : A Lot 6 Click Score: 17    End of Session Equipment Utilized During Treatment: Gait belt Activity Tolerance: Patient tolerated treatment well Patient left: in chair;with call bell/phone within reach;with family/visitor present Nurse Communication: Mobility status PT Visit Diagnosis: Difficulty in walking, not elsewhere classified (R26.2);Pain;Muscle weakness (generalized) (M62.81) Pain - Right/Left: Left Pain - part of body: Knee    Time: [id number] PT Time Calculation (min) (ACUTE ONLY): 25 min   Charges:   PT Evaluation $PT Eval Low Complexity: 1 Low PT Treatments $Gait Training: 8-22 mins        Ralene BatheUhlenberg, Ling Kistler PT 03/17/2018  Acute Rehabilitation Services Pager [phone] Office [phone]

## 2018-03-17 NOTE — Anesthesia Postprocedure Evaluation (Signed)
Anesthesia Post Note  Patient: Felicia Gordon  Procedure(s) Performed: LEFT TOTAL KNEE ARTHROPLASTY REVISION (Left Knee)     Patient location during evaluation: PACU Anesthesia Type: General Level of consciousness: awake and alert Pain management: pain level controlled Vital Signs Assessment: post-procedure vital signs reviewed and stable Respiratory status: spontaneous breathing, nonlabored ventilation, respiratory function stable and patient connected to nasal cannula oxygen Cardiovascular status: blood pressure returned to baseline and stable Postop Assessment: no apparent nausea or vomiting Anesthetic complications: no    Last Vitals:  Vitals:   03/17/18 1219 03/17/18 1310  BP: 118/77 114/70  Pulse: 87 94  Resp: 16 16  Temp:  36.9 C  SpO2: 93% 97%    Last Pain:  Vitals:   03/17/18 1310  TempSrc: Oral  PainSc:                  Felicia Gordon

## 2018-03-17 NOTE — Anesthesia Postprocedure Evaluation (Signed)
Anesthesia Post Note  Patient: Janit L Mallozzi  Procedure(s) Performed: LEFT TOTAL KNEE ARTHROPLASTY REVISION (Left Knee)     Patient location during evaluation: PACU Anesthesia Type: General Level of consciousness: awake and alert Pain management: pain level controlled Vital Signs Assessment: post-procedure vital signs reviewed and stable Respiratory status: spontaneous breathing, nonlabored ventilation, respiratory function stable and patient connected to nasal cannula oxygen Cardiovascular status: blood pressure returned to baseline and stable Postop Assessment: no apparent nausea or vomiting Anesthetic complications: no    Last Vitals:  Vitals:   03/17/18 1200 03/17/18 1219  BP: 112/72 118/77  Pulse: 86 87  Resp: 15 16  Temp:    SpO2: 99% 93%    Last Pain:  Vitals:   03/17/18 1200  TempSrc:   PainSc: Asleep                 Lynleigh Kovack A Pricilla Moehle     

## 2018-03-17 NOTE — Op Note (Signed)
NAME: Felicia Gordon, Felicia L. MEDICAL RECORD ZO:[id number] ACCOUNT [id number] DATE OF BIRTH:12-23-1970 FACILITY: [facility] LOCATION: [facility] PHYSICIAN:Brayen Bunn Dulcy FannyV. Saina Waage, MD  OPERATIVE REPORT  DATE OF PROCEDURE:  03/17/2018  PREOPERATIVE DIAGNOSIS:  Failed unstable left total knee arthroplasty.  POSTOPERATIVE DIAGNOSIS:  Failed unstable left total knee arthroplasty.  PROCEDURE:  Left total knee arthroplasty revision.  SURGEON:  Ollen GrossFrank Karsen Nakanishi, MD  ASSISTANT:  Arther AbbottKristie Edmisten, PA-C  ANESTHESIA:  General and adductor canal block.  ESTIMATED BLOOD LOSS:  200.  DRAINS:  Hemovac x1.  TOURNIQUET TIME:  37 minutes at 300 mmHg.  COMPLICATIONS:  None.  CONDITION:  Stable to recovery.  BRIEF CLINICAL NOTE:  Felicia Gordon is a 47 year old female who [facility].  She presented to me a few years back with instability.  She had a polyethylene revision to a thicker  polyethylene, which initially solved the instability problem, but she has had progressive pain and worsening and stability once again.  It is felt that at this point, the most predictable means of getting her better would be to revise the entire knee.   She presents today for total knee arthroplasty revision.  PROCEDURE IN DETAIL:  After successful administration of adductor canal block and general anesthesia, tourniquet was placed high on her left thigh and left lower extremity is prepped and draped in the usual sterile fashion.  Extremity was wrapped in  Esmarch and tourniquet inflated to 300 mmHg.  Midline incision is made with a 10 blade through subcutaneous tissue to the level of the extensor mechanism.  A fresh blade was used to make a medial parapatellar arthrotomy.  We did not encounter any fluid  in the joint.  Soft tissue on the proximal medial tibia subperiosteally elevated to the joint line with a knife and into the semimembranosus bursa with a Cobb elevator.   Soft tissue laterally was elevated with attention being paid to avoid the patellar  tendon on the tibial tubercle.  I was able to evert her patella.  We then flexed the knee to 90 degrees.  An osteotome was used to disrupt the interface between the tibial polyethylene and tibial tray.  The tibial polyethylene was subsequently removed.   The tibia was then subluxed forward and circumferential retraction is placed.  An oscillating saw was used to disrupt the interface between the tibial component and bone and the tibial component was removed with essentially no bone loss.  The  extramedullary tibial alignment guide was then placed referencing proximally at the medial aspect of the tibial tubercle and distally along the second metatarsal axis and tibial crest.  I adjusted the slope to what is more of an anatomic slope.   Previously, her slope was significantly posterior, which could have contributed to some the flexion instability.  The block is pinned to remove about 2 mm off the cut bone surface and resection was made with an oscillating saw.  The size is a 2.5.  I  then removed the cement from the tibial canal and we reamed up to 13 mm for a 13 mm stem.  The proximal tibia was then prepared with the modular drill then modular drill plus stem extension.  We then prepared a 29 mm broach for a 29 sleeve.  Femur was then addressed.  The osteotome was used to disrupt the interface between the femoral component and bone.  Femoral component was removed with minimal to no bone loss.  The femoral canal was  then accessed.  We thoroughly irrigated the canal and  then reamed up to 16 mm, which had an excellent fit.  The reamer was left in place to serve as our intramedullary alignment guide.  Distal femoral cutting block is placed in 5 degrees of valgus and 2 mm were resected off of the medial and lateral sides.   I decided to place a 4 mm distal augments in order to build the joint line back to normal position.  The  size 2.5 was also most appropriate femoral component.  Two 5 cutting block is placed.  Rotation was marked off the epicondylar axis and confirmed by  creating a rectangular flexion gap at 90 degrees of flexion.  The block was pinned in this rotation.  We basically took no bone anterior or with the chamfers and took minimal bone with the posterior condylar cut.  The block was removed and the  intercondylar block was placed.  The cut was then made for the TC3 intercondylar cut.  Trials were then built in place.  On the tibial side we had a size 2.5 MBT revision tray with 10 mm medial and lateral augments, a 29 sleeve and a 13 x 30 stem extension.  This had excellent fit on the cut tibial surface.  On the femoral side, it was a  2.5 TC3 femur with 4 mm augments posteriorly and distally medial and lateral as well as a 16 x 75 stem extension in the +2 position in 5 degrees of valgus.  The trials were placed with excellent fit on both sides.  Insert was up to 17.5 to get excellent  stability.  With the 17.5 mm thickness insert, full extension was achieved with excellent varus/valgus and AP balance throughout full range of motion.  Patella was everted and the patellar component was well fixed and it tracks normally, so we just did a  patelloplasty, removing all the soft tissue around the edges of the patella and the tissue that was covering the patellar button.  After I did this, the patella continued to track normally.  At this time, the components were opened and assembled on the  back table.  I released the tourniquet and we had minimal bleeding.  I stopped any bleeding that was present.  The components were assembled as per the size of the trials.  At 8 minutes, when I put the  tourniquet back up, we noted that there was barely  any bleeding, so I just left the tourniquet down for the rest of the case.  The trials were removed and the cut bone surface was then prepared with pulsatile lavage.  Three batches of  gentamicin impregnated cement are mixed.  Note that the tibial cement  restrictor had been placed earlier and it was a size 4.  The cement was mixed and injected into the tibial canal and then also placed on the cut tibial surface.  Tibial component was cemented first. Once again, this is a 2.5 MBT tray with a 13 x 30 stem  extension, 29 sleeve and 10 mm augments medial and lateral.  It was then inserted and all extruded cement removed.  Femoral side was then cemented distally with the press-fit stem.  Again, this is a 2.5 TC3 femur with 4 mm augments distally medial and  lateral, 4 mm augments posteriorly, medial and lateral., a 16 x 75 stem in a +2 position in 5 degrees of valgus.  This was also impacted and all extruded cement removed.  A 17.5 mm trial insert was placed.  Knee held in full extension and any remaining  cement that was extruded was removed.  There is fantastic stability to varus valgus stress in extension and all the way through flexion to AP testing.  The trial was then removed and the permanent 17.5 mm TC3 rotating platform insert was placed into the  tibial tray.  The knee was reduced again with excellent stability throughout full range of motion.  The patella tracks normally.  Wound was copiously irrigated with saline solution and the arthrotomy closed over a Hemovac drain with a running 0 Stratafix  suture.  Flexion against gravity was about 135 degrees.  The second limb of the Hemovac drain was placed in the subcutaneous tissues.  The subcu was closed with interrupted 2-0 Vicryl.  The subcuticular layer was then closed with running 4-0 Monocryl.   Incision was cleaned and dried and Steri-Strips and a bulky sterile dressing were applied.  The drain was hooked to suction and the incision clean and dried, a bulky sterile dressing applied.    She was placed into a knee immobilizer, awakened and transported to recovery in stable condition.  Note that a surgical assistant was a medical  necessity for this procedure to do it in a safe and expeditious manner.  Surgical assistance necessary for retraction of vital ligaments and neurovascular structures and for proper positioning of the limb for  safe removal of the old implants and then for safe and accurate placement of the new implants.  AN/NUANCE  D:03/17/2018 T:03/17/2018 JOB:[id number]

## 2018-03-17 NOTE — Interval H&P Note (Signed)
History and Physical Interval Note:  03/17/2018 7:43 AM  Felicia SolianJennifer L Trivett  has presented today for surgery, with the diagnosis of unstable left total knee arthroplasty  The various methods of treatment have been discussed with the patient and family. After consideration of risks, benefits and other options for treatment, the patient has consented to  Procedure(s): LEFT TOTAL KNEE ARTHROPLASTY REVISION (Left) as a surgical intervention .  The patient's history has been reviewed, patient examined, no change in status, stable for surgery.  I have reviewed the patient's chart and labs.  Questions were answered to the patient's satisfaction.     Homero FellersFrank Nitesh Pitstick

## 2018-03-17 NOTE — Transfer of Care (Signed)
Immediate Anesthesia Transfer of Care Note  Patient: Felicia Gordon  Procedure(s) Performed: LEFT TOTAL KNEE ARTHROPLASTY REVISION (Left Knee)  Patient Location: PACU  Anesthesia Type:General  Level of Consciousness: awake  Airway & Oxygen Therapy: Patient Spontanous Breathing and Patient connected to face mask oxygen  Post-op Assessment: Report given to RN and Post -op Vital signs reviewed and stable  Post vital signs: Reviewed and stable  Last Vitals:  Vitals Value Taken Time  BP 128/85 03/17/2018 10:38 AM  Temp    Pulse 83 03/17/2018 10:39 AM  Resp    SpO2 100 % 03/17/2018 10:39 AM  Vitals shown include unvalidated device data.  Last Pain:  Vitals:   03/17/18 [id number]  TempSrc: Oral         Complications: No apparent anesthesia complications

## 2018-03-17 NOTE — Anesthesia Procedure Notes (Signed)
Date/Time: 03/17/2018 8:00 AM Performed by: Florene Routeeardon, Kamali Sakata L, CRNA Oxygen Delivery Method: Nasal cannula

## 2018-03-17 NOTE — Anesthesia Procedure Notes (Signed)
Anesthesia Regional Block: Adductor canal block   Pre-Anesthetic Checklist: ,, timeout performed, Correct Patient, Correct Site, Correct Laterality, Correct Procedure, Correct Position, site marked, Risks and benefits discussed,  Surgical consent,  Pre-op evaluation,  At surgeon's request and post-op pain management  Laterality: Left  Prep: Maximum Sterile Barrier Precautions used, chloraprep       Needles:  Injection technique: Single-shot  Needle Type: Echogenic Needle     Needle Length: 9cm  Needle Gauge: 21     Additional Needles:   Procedures:,,,, ultrasound used (permanent image in chart),,,,  Narrative:  Start time: 03/17/2018 8:00 AM End time: 03/17/2018 8:09 AM Injection made incrementally with aspirations every 5 mL.  Performed by: Personally  Anesthesiologist: Trevor IhaHouser, Muaad Boehning A, MD  Additional Notes: 1 attempt . Pt tolerated procedure well.

## 2018-03-17 NOTE — Anesthesia Procedure Notes (Signed)
Procedure Name: LMA Insertion Date/Time: 03/17/2018 8:39 AM Performed by: Florene Routeeardon, Samier Jaco L, CRNA Patient Re-evaluated:Patient Re-evaluated prior to induction Oxygen Delivery Method: Circle system utilized Preoxygenation: Pre-oxygenation with 100% oxygen Induction Type: IV induction Ventilation: Mask ventilation without difficulty LMA: LMA inserted LMA Size: 4.0 Number of attempts: 1 Placement Confirmation: positive ETCO2 and breath sounds checked- equal and bilateral Dental Injury: Teeth and Oropharynx as per pre-operative assessment

## 2018-03-17 NOTE — Brief Op Note (Signed)
03/17/2018  10:06 AM  PATIENT:  Felicia Gordon  47 y.o. female  PRE-OPERATIVE DIAGNOSIS:  unstable left total knee arthroplasty  POST-OPERATIVE DIAGNOSIS:  unstable left total knee arthroplasty  PROCEDURE:  Procedure(s): LEFT TOTAL KNEE ARTHROPLASTY REVISION (Left)  SURGEON:  Surgeon(s) and Role:    Ollen Gross* Maicey Barrientez, MD - Primary  PHYSICIAN ASSISTANT:   ASSISTANTS: Arther AbbottKristie Edmisten, PA-C   ANESTHESIA:   regional and general  EBL:  200 mL   BLOOD ADMINISTERED:none  DRAINS: (Medium) Hemovact drain(s) in the left knee with  Suction Open   LOCAL MEDICATIONS USED:  NONE  COUNTS:  YES  TOURNIQUET:   Total Tourniquet Time Documented: Thigh (Left) - 38 minutes Total: Thigh (Left) - 38 minutes   DICTATION: .Other Dictation: Dictation Number [id number]  PLAN OF CARE: Admit to inpatient   PATIENT DISPOSITION:  PACU - hemodynamically stable.

## 2018-03-17 NOTE — Anesthesia Postprocedure Evaluation (Signed)
Anesthesia Post Note  Patient: Vanissa L Villacres  Procedure(s) Performed: LEFT TOTAL KNEE ARTHROPLASTY REVISION (Left Knee)     Patient location during evaluation: PACU Anesthesia Type: General Level of consciousness: awake and alert Pain management: pain level controlled Vital Signs Assessment: post-procedure vital signs reviewed and stable Respiratory status: spontaneous breathing, nonlabored ventilation, respiratory function stable and patient connected to nasal cannula oxygen Cardiovascular status: blood pressure returned to baseline and stable Postop Assessment: no apparent nausea or vomiting Anesthetic complications: no    Last Vitals:  Vitals:   03/17/18 1200 03/17/18 1219  BP: 112/72 118/77  Pulse: 86 87  Resp: 15 16  Temp:    SpO2: 99% 93%    Last Pain:  Vitals:   03/17/18 1200  TempSrc:   PainSc: Asleep                 Stephen A Houser     

## 2018-03-18 ENCOUNTER — Encounter (HOSPITAL_COMMUNITY): Payer: Self-pay | Admitting: Orthopedic Surgery

## 2018-03-18 LAB — CBC
HEMATOCRIT: 34.9 % — AB (ref 36.0–46.0)
HEMOGLOBIN: 10.9 g/dL — AB (ref 12.0–15.0)
MCH: 30.1 pg (ref 26.0–34.0)
MCHC: 31.2 g/dL (ref 30.0–36.0)
MCV: 96.4 fL (ref 80.0–100.0)
NRBC: 0 % (ref 0.0–0.2)
Platelets: 310 10*3/uL (ref 150–400)
RBC: 3.62 MIL/uL — AB (ref 3.87–5.11)
RDW: 12.4 % (ref 11.5–15.5)
WBC: 15.1 10*3/uL — ABNORMAL HIGH (ref 4.0–10.5)

## 2018-03-18 LAB — BASIC METABOLIC PANEL
ANION GAP: 5 (ref 5–15)
BUN: 5 mg/dL — ABNORMAL LOW (ref 6–20)
CO2: 28 mmol/L (ref 22–32)
Calcium: 8.2 mg/dL — ABNORMAL LOW (ref 8.9–10.3)
Chloride: 107 mmol/L (ref 98–111)
Creatinine, Ser: 0.58 mg/dL (ref 0.44–1.00)
GFR calc Af Amer: >60 mL/min (ref >60)
GFR calc non Af Amer: >60 mL/min (ref >60)
GLUCOSE: 128 mg/dL — AB (ref 70–99)
POTASSIUM: 3.8 mmol/L (ref 3.5–5.1)
Sodium: 140 mmol/L (ref 135–145)

## 2018-03-18 MED ORDER — HYDROMORPHONE HCL 2 MG PO TABS: 2.0000 mg | ORAL_TABLET | Freq: Four times a day (QID) | ORAL | 0 refills | Status: DC | PRN

## 2018-03-18 MED ORDER — RIVAROXABAN 10 MG PO TABS: 10.0000 mg | ORAL_TABLET | Freq: Every day | ORAL | 0 refills | Status: AC

## 2018-03-18 MED ORDER — TIZANIDINE HCL 4 MG PO TABS: 4.0000 mg | ORAL_TABLET | Freq: Four times a day (QID) | ORAL | 0 refills | Status: AC | PRN

## 2018-03-18 MED ORDER — TIZANIDINE HCL 4 MG PO TABS
4.0000 mg | ORAL_TABLET | Freq: Four times a day (QID) | ORAL | Status: DC | PRN
  Administered 2018-03-18 – 2018-03-20 (×7): 4 mg via ORAL
  Filled 2018-03-18 (×7): qty 1

## 2018-03-18 MED ORDER — GABAPENTIN 300 MG PO CAPS
300.0000 mg | ORAL_CAPSULE | Freq: Two times a day (BID) | ORAL | Status: DC
  Administered 2018-03-18 – 2018-03-20 (×5): 300 mg via ORAL
  Filled 2018-03-18 (×5): qty 1

## 2018-03-18 NOTE — Progress Notes (Signed)
Physical Therapy Treatment Patient Details Name: Felicia Gordon MRN: [id number] DOB: 02/19/1971 Today's Date: 03/18/2018    History of Present Illness L TKA revision (8th surgery)    PT Comments    POD # 1 am session Limited session due to pain, assisted OOB to amb 18 feet.  Assisted back to bed per pt request "more comfortable" and performed only 3 TE's AP, knee presses and heel slides to tolerance.  Applied ICE.  Follow Up Recommendations  Outpatient PT;Follow surgeon's recommendation for DC plan and follow-up therapies([facility]     Equipment Recommendations  None recommended by PT    Recommendations for Other Services       Precautions / Restrictions Precautions Precautions: Knee Precaution Comments: reviewed no pillow under knee Restrictions Weight Bearing Restrictions: Yes Other Position/Activity Restrictions: WBAT    Mobility  Bed Mobility Overal bed mobility: Needs Assistance Bed Mobility: Supine to Sit;Sit to Supine     Supine to sit: Min guard     General bed mobility comments: min guard for LLE, self assisted LLE with RLE  Transfers Overall transfer level: Needs assistance Equipment used: Rolling walker (2 wheeled) Transfers: Sit to/from Stand Sit to Stand: Min guard         General transfer comment: increased time and VC's for safety with turns   Ambulation/Gait Ambulation/Gait assistance: Min guard Gait Distance (Feet): 18 Feet Assistive device: Rolling walker (2 wheeled) Gait Pattern/deviations: Step-to pattern;Decreased weight shift to left;Decreased stride length Gait velocity: decreased   General Gait Details: distance limited by pain   Stairs             Wheelchair Mobility    Modified Rankin (Stroke Patients Only)       Balance                                            Cognition Arousal/Alertness: Awake/alert Behavior During Therapy: WFL for tasks assessed/performed Overall Cognitive Status:  Within Functional Limits for tasks assessed                                        Exercises   Total Knee Replacement TE's 10 reps B LE ankle pumps 10 reps towel squeezes 10 reps knee presses 10 reps heel slides Followed by ICE     General Comments        Pertinent Vitals/Pain Pain Assessment: 0-10 Pain Score: 8  Pain Location: L knee Pain Descriptors / Indicators: Grimacing;Operative site guarding;Sharp Pain Intervention(s): Monitored during session;Repositioned;Ice applied;Premedicated before session    Home Living                      Prior Function            PT Goals (current goals can now be found in the care plan section) Progress towards PT goals: Progressing toward goals    Frequency    7X/week      PT Plan Current plan remains appropriate    Co-evaluation              AM-PAC PT "6 Clicks" Daily Activity  Outcome Measure  Difficulty turning over in bed (including adjusting bedclothes, sheets and blankets)?: A Little Difficulty moving from lying on back to sitting on the side of  the bed? : A Little Difficulty sitting down on and standing up from a chair with arms (e.g., wheelchair, bedside commode, etc,.)?: A Little Help needed moving to and from a bed to chair (including a wheelchair)?: A Little Help needed walking in hospital room?: A Little Help needed climbing 3-5 steps with a railing? : A Little 6 Click Score: 18    End of Session Equipment Utilized During Treatment: Gait belt Activity Tolerance: Patient limited by pain Patient left: in bed;with call bell/phone within reach(pt declined recliner ) Nurse Communication: Mobility status PT Visit Diagnosis: Difficulty in walking, not elsewhere classified (R26.2);Pain;Muscle weakness (generalized) (M62.81) Pain - Right/Left: Left Pain - part of body: Knee     Time: 1610-9604 PT Time Calculation (min) (ACUTE ONLY): 25 min  Charges:  $Gait Training: 8-22  mins $Therapeutic Exercise: 8-22 mins                     [name]  PTA Acute  Rehabilitation Services Pager      [phone] Office      [phone]

## 2018-03-18 NOTE — Plan of Care (Signed)
Plan of care reviewed and discussed with the patient. Focus primarily around pain management, mobility.  Provide frequent reassurance to patient.

## 2018-03-18 NOTE — Progress Notes (Signed)
PT Cancellation Note  Patient Details Name: Francella SolianJennifer L Richer MRN: [id number] DOB: 07/27/1970   Cancelled Treatment:     Returned shortly after 2 pm for second session, pt stated her pain was "too bad" and asked for me to return at 3 pm when she can have more IV pain meds.  Returned shortly after 3 pm, pt declined again stating "pain is too bad, I think I did too much this morning".  Pt stated she had been up to commode "a few times with nursing.     Felecia ShellingLori Baruc Tugwell  PTA Acute  Rehabilitation Services Pager      [phone] Office      [phone]

## 2018-03-18 NOTE — Progress Notes (Signed)
   Subjective: 1 Day Post-Op Procedure(s) (LRB): LEFT TOTAL KNEE ARTHROPLASTY REVISION (Left) Patient reports pain as moderate.   Patient seen in rounds by Dr. Lequita HaltAluisio. Patient is well, and has had no acute complaints or problems other than pain in the left knee. Denies chest pain, SOB, or calf pain. No issues overnight. Foley catheter removed this AM.  We will continue therapy today.   Objective: Vital signs in last 24 hours: Temp:  [97.9 F (36.6 C)-98.9 F (37.2 C)] 98.5 F (36.9 C) (11/14 0504) Pulse Rate:  [82-108] 103 (11/14 0504) Resp:  [11-21] 17 (11/14 0504) BP: (103-152)/(68-106) 113/68 (11/14 0504) SpO2:  [93 %-100 %] 97 % (11/14 0504)  Intake/Output from previous day:  Intake/Output Summary (Last 24 hours) at 03/18/2018 0751 Last data filed at 03/18/2018 0600 Gross per 24 hour  Intake 3640.08 ml  Output 3920 ml  Net -279.92 ml    Labs: Recent Labs    03/18/18 0406  HGB 10.9*   Recent Labs    03/18/18 0406  WBC 15.1*  RBC 3.62*  HCT 34.9*  PLT 310   Recent Labs    03/18/18 0406  NA 140  K 3.8  CL 107  CO2 28  BUN 5*  CREATININE 0.58  GLUCOSE 128*  CALCIUM 8.2*   Exam: General - Patient is Alert and Oriented Extremity - Neurologically intact Neurovascular intact Sensation intact distally Dorsiflexion/Plantar flexion intact Dressing - dressing C/D/I Motor Function - intact, moving foot and toes well on exam.   Past Medical History:  Diagnosis Date  . Anxiety   . Depression   . 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 PONV (postoperative nausea and vomiting)   . Stomach ulcer    hx of was d/t NSAID use, now resolved     Assessment/Plan: 1 Day Post-Op Procedure(s) (LRB): LEFT TOTAL KNEE ARTHROPLASTY REVISION (Left) Active Problems:   Failed total knee arthroplasty (HCC)  Estimated body mass index is 27.12 kg/m as calculated from the following:   Height as of this encounter: 5\' 5"  (1.651 m).   Weight as  of this encounter: 73.9 kg. Advance diet Up with therapy  DVT Prophylaxis - Xarelto Weight bearing as tolerated. D/C O2 and pulse ox and try on room air. Hemovac pulled without difficulty, will continue therapy today.  Plan is to go Home after hospital stay. Plan for discharge tomorrow if progresses with therapy and meeting her goals.   Arther AbbottKristie Chauncey Bruno, PA-C Orthopedic Surgery 03/18/2018, 7:51 AM

## 2018-03-18 NOTE — Care Management Note (Signed)
Case Management Note  Patient Details  Name: Felicia Gordon MRN: [id number] Date of Birth: 10/31/1970  Subjective/Objective:   Spoke with patient at bedside. Confirmed plan for OP PT, already arranged. Has RW and 3n1. [phone]                 Action/Plan:   Expected Discharge Date:  03/18/18               Expected Discharge Plan:  OP Rehab  In-House Referral:  NA  Discharge planning Services  CM Consult  Post Acute Care Choice:  NA Choice offered to:  Patient  DME Arranged:  N/A DME Agency:  NA  HH Arranged:  NA HH Agency:  NA  Status of Service:  Completed, signed off  If discussed at Long Length of Stay Meetings, dates discussed:    Additional Comments:  Alexis Goodelleele, Ladana Chavero K, RN 03/18/2018, 10:58 AM

## 2018-03-19 LAB — BASIC METABOLIC PANEL
ANION GAP: 5 (ref 5–15)
BUN: 7 mg/dL (ref 6–20)
CO2: 29 mmol/L (ref 22–32)
Calcium: 8.1 mg/dL — ABNORMAL LOW (ref 8.9–10.3)
Chloride: 105 mmol/L (ref 98–111)
Creatinine, Ser: 0.55 mg/dL (ref 0.44–1.00)
GFR calc Af Amer: >60 mL/min (ref >60)
GLUCOSE: 104 mg/dL — AB (ref 70–99)
Potassium: 4.1 mmol/L (ref 3.5–5.1)
Sodium: 139 mmol/L (ref 135–145)

## 2018-03-19 LAB — CBC
HCT: 33.4 % — ABNORMAL LOW (ref 36.0–46.0)
Hemoglobin: 10.4 g/dL — ABNORMAL LOW (ref 12.0–15.0)
MCH: 30.1 pg (ref 26.0–34.0)
MCHC: 31.1 g/dL (ref 30.0–36.0)
MCV: 96.8 fL (ref 80.0–100.0)
NRBC: 0 % (ref 0.0–0.2)
PLATELETS: 296 10*3/uL (ref 150–400)
RBC: 3.45 MIL/uL — ABNORMAL LOW (ref 3.87–5.11)
RDW: 12.7 % (ref 11.5–15.5)
WBC: 13.7 10*3/uL — ABNORMAL HIGH (ref 4.0–10.5)

## 2018-03-19 MED ORDER — HYDROMORPHONE HCL 1 MG/ML IJ SOLN
1.0000 mg | INTRAMUSCULAR | Status: DC | PRN
  Administered 2018-03-19 (×3): 1 mg via INTRAVENOUS
  Filled 2018-03-19 (×3): qty 1

## 2018-03-19 MED ORDER — HYDROMORPHONE HCL 1 MG/ML IJ SOLN
1.0000 mg | INTRAMUSCULAR | Status: DC | PRN
  Administered 2018-03-19 – 2018-03-20 (×6): 1 mg via INTRAVENOUS
  Filled 2018-03-19 (×6): qty 1

## 2018-03-19 NOTE — Progress Notes (Signed)
Physical Therapy Treatment Patient Details Name: Felicia Gordon MRN: [id number] DOB: [birth year] Today's Date: 03/19/2018    History of Present Illness L TKA revision (8th surgery)  Patient reviewed all HEP, ambulating well.   PT Comments       Follow Up Recommendations  Outpatient PT;Follow surgeon's recommendation for DC plan and follow-up therapies     Equipment Recommendations    none   Recommendations for Other Services       Precautions / Restrictions Precautions Precautions: Knee Precaution Comments: reviewed no pillow under knee Restrictions Other Position/Activity Restrictions: WBAT    Mobility  Bed Mobility Overal bed mobility: Modified Independent                Transfers Overall transfer level: Modified independent Equipment used: Rolling walker (2 wheeled)                Ambulation/Gait Ambulation/Gait assistance: Min guard Gait Distance (Feet): 90 Feet Assistive device: Rolling walker (2 wheeled) Gait Pattern/deviations: Step-to pattern;Step-through pattern         Stairs             Wheelchair Mobility    Modified Rankin (Stroke Patients Only)       Balance                                            Cognition Arousal/Alertness: Awake/alert                                            Exercises      General Comments        Pertinent Vitals/Pain Pain Score: 8  Pain Location: L knee Pain Descriptors / Indicators: Grimacing;Operative site guarding;Sharp Pain Intervention(s): Monitored during session;Premedicated before session;Ice applied    Home Living                      Prior Function            PT Goals (current goals can now be found in the care plan section) Progress towards PT goals: Progressing toward goals    Frequency    7X/week      PT Plan Current plan remains appropriate    Co-evaluation              AM-PAC PT "6 Clicks"  Daily Activity  Outcome Measure  Difficulty turning over in bed (including adjusting bedclothes, sheets and blankets)?: None Difficulty moving from lying on back to sitting on the side of the bed? : None Difficulty sitting down on and standing up from a chair with arms (e.g., wheelchair, bedside commode, etc,.)?: A Little Help needed moving to and from a bed to chair (including a wheelchair)?: A Little Help needed walking in hospital room?: A Little Help needed climbing 3-5 steps with a railing? : A Little 6 Click Score: 20    End of Session   Activity Tolerance: Patient limited by pain Patient left: in bed;with call bell/phone within reach Nurse Communication: Mobility status PT Visit Diagnosis: Difficulty in walking, not elsewhere classified (R26.2);Pain;Muscle weakness (generalized) (M62.81) Pain - Right/Left: Left Pain - part of body: Knee     Time: 1034-1050 PT Time Calculation (min) (ACUTE ONLY): 16 min  Charges:  $Gait Training:  8-22 mins                        Rada HayHill, Ardra Kuznicki Elizabeth 03/19/2018, 1:39 PM

## 2018-03-19 NOTE — Progress Notes (Signed)
   Subjective: 2 Days Post-Op Procedure(s) (LRB): LEFT TOTAL KNEE ARTHROPLASTY REVISION (Left) Patient reports pain as moderate.   Patient seen in rounds for Dr. Lequita HaltAluisio. Patient is well, and has had no acute complaints or problems other than pain in the left knee. Was requiring IV pain medication Q4 hours yesterday. Voiding without difficulty and positive flatus.Denies chest pain, SOB, or calf pain. Plan is to go Home after hospital stay.  Objective: Vital signs in last 24 hours: Temp:  [98 F (36.7 C)-99.7 F (37.6 C)] 98 F (36.7 C) (11/15 0535) Pulse Rate:  [88-103] 88 (11/15 0535) Resp:  [14-18] 14 (11/15 0535) BP: (116-148)/(69-95) 130/80 (11/15 0535) SpO2:  [95 %-97 %] 97 % (11/15 0535)  Intake/Output from previous day:  Intake/Output Summary (Last 24 hours) at 03/19/2018 0754 Last data filed at 03/19/2018 0600 Gross per 24 hour  Intake 1115.05 ml  Output 2650 ml  Net -1534.95 ml    Labs: Recent Labs    03/18/18 0406 03/19/18 0438  HGB 10.9* 10.4*   Recent Labs    03/18/18 0406 03/19/18 0438  WBC 15.1* 13.7*  RBC 3.62* 3.45*  HCT 34.9* 33.4*  PLT 310 296   Recent Labs    03/18/18 0406 03/19/18 0438  NA 140 139  K 3.8 4.1  CL 107 105  CO2 28 29  BUN 5* 7  CREATININE 0.58 0.55  GLUCOSE 128* 104*  CALCIUM 8.2* 8.1*   Exam: General - Patient is Alert and Oriented Extremity - Neurologically intact Neurovascular intact Sensation intact distally Dorsiflexion/Plantar flexion intact Dressing/Incision - clean, dry, no drainage Motor Function - intact, moving foot and toes well on exam.   Past Medical History:  Diagnosis Date  . Anxiety   . Depression   . 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 PONV (postoperative nausea and vomiting)   . Stomach ulcer    hx of was d/t NSAID use, now resolved     Assessment/Plan: 2 Days Post-Op Procedure(s) (LRB): LEFT TOTAL KNEE ARTHROPLASTY REVISION (Left) Active Problems:  Failed total knee arthroplasty (HCC)  Estimated body mass index is 27.12 kg/m as calculated from the following:   Height as of this encounter: 5\' 5"  (1.651 m).   Weight as of this encounter: 73.9 kg. Up with therapy D/C IV fluids  DVT Prophylaxis - Xarelto Weight-bearing as tolerated  IV medication discontinued as patient can not be discharged until tolerating only PO meds. If she progresses with therapy and is meeting her goals and pain is managed, can discharge to home this afternoon after two sessions of PT. Otherwise will keep until tomorrow. [facility]. Follow-up in the office in 2 weeks with Dr. Lequita HaltAluisio.  Arther AbbottKristie Tashya Alberty, PA-C Orthopedic Surgery 03/19/2018, 7:54 AM

## 2018-03-19 NOTE — Plan of Care (Signed)
Plan of care reviewed with patient. Pt is stable. Pain management in progress, tolerating well.

## 2018-03-20 LAB — CBC
HCT: 34.4 % — ABNORMAL LOW (ref 36.0–46.0)
Hemoglobin: 10.6 g/dL — ABNORMAL LOW (ref 12.0–15.0)
MCH: 29.3 pg (ref 26.0–34.0)
MCHC: 30.8 g/dL (ref 30.0–36.0)
MCV: 95 fL (ref 80.0–100.0)
NRBC: 0 % (ref 0.0–0.2)
PLATELETS: 309 10*3/uL (ref 150–400)
RBC: 3.62 MIL/uL — AB (ref 3.87–5.11)
RDW: 12.7 % (ref 11.5–15.5)
WBC: 9.4 10*3/uL (ref 4.0–10.5)

## 2018-03-20 MED ORDER — HYDROMORPHONE HCL 4 MG PO TABS: 4.0000 mg | ORAL_TABLET | Freq: Four times a day (QID) | ORAL | 0 refills | Status: AC | PRN

## 2018-03-20 NOTE — Progress Notes (Signed)
Physical Therapy Treatment Patient Details Name: Felicia Gordon MRN: [id number] DOB: 03/12/1971 Today's Date: 03/20/2018    History of Present Illness L TKA revision (8th surgery)    PT Comments    Pt ready for d/c home;  Discussed slow progression d/t pt collagen disorder/soft tissue laxity causing issues in past; pt continues to have pain but reports improvement today  Follow Up Recommendations  Outpatient PT;Follow surgeon's recommendation for DC plan and follow-up therapies     Equipment Recommendations  None recommended by PT    Recommendations for Other Services       Precautions / Restrictions Precautions Precautions: Knee Precaution Comments: reviewed no pillow under knee Restrictions Weight Bearing Restrictions: No Other Position/Activity Restrictions: WBAT    Mobility  Bed Mobility Overal bed mobility: Modified Independent                Transfers Overall transfer level: Modified independent Equipment used: Rolling walker (2 wheeled) Transfers: Sit to/from Stand Sit to Stand: Modified independent (Device/Increase time)            Ambulation/Gait Ambulation/Gait assistance: Supervision;Modified independent (Device/Increase time) Gait Distance (Feet): 30 Feet Assistive device: Rolling walker (2 wheeled);Crutches Gait Pattern/deviations: Step-through pattern;Decreased stride length;Decreased weight shift to right     General Gait Details: cues for wt shift to right, step  length   Stairs             Wheelchair Mobility    Modified Rankin (Stroke Patients Only)       Balance Overall balance assessment: Mild deficits observed, not formally tested                                          Cognition Arousal/Alertness: Awake/alert Behavior During Therapy: WFL for tasks assessed/performed Overall Cognitive Status: Within Functional Limits for tasks assessed                                         Exercises Total Joint Exercises Ankle Circles/Pumps: AROM;Both;10 reps;Supine Quad Sets: AROM;Both;Supine;5 reps Heel Slides: AAROM;Left;Supine;5 reps Knee Flexion: AROM;Left;5 reps Goniometric ROM: grossly 6* to 55* AROM L knee    General Comments        Pertinent Vitals/Pain Pain Assessment: 0-10 Pain Score: 3  Pain Location: L knee Pain Descriptors / Indicators: Discomfort Pain Intervention(s): Limited activity within patient's tolerance;Monitored during session;Premedicated before session;Repositioned;Ice applied    Home Living                      Prior Function            PT Goals (current goals can now be found in the care plan section) Acute Rehab PT Goals Patient Stated Goal: return to work at [employer], play with dogs PT Goal Formulation: With patient/family Time For Goal Achievement: 03/24/18 Potential to Achieve Goals: Good Progress towards PT goals: Progressing toward goals    Frequency    7X/week      PT Plan Current plan remains appropriate    Co-evaluation              AM-PAC PT "6 Clicks" Daily Activity  Outcome Measure  Difficulty turning over in bed (including adjusting bedclothes, sheets and blankets)?: None Difficulty moving from lying on back to sitting on the side  of the bed? : None Difficulty sitting down on and standing up from a chair with arms (e.g., wheelchair, bedside commode, etc,.)?: None Help needed moving to and from a bed to chair (including a wheelchair)?: None Help needed walking in hospital room?: None Help needed climbing 3-5 steps with a railing? : None 6 Click Score: 24    End of Session   Activity Tolerance: Patient tolerated treatment well Patient left: in bed;with call bell/phone within reach;with family/visitor present   PT Visit Diagnosis: Difficulty in walking, not elsewhere classified (R26.2);Pain;Muscle weakness (generalized) (M62.81) Pain - Right/Left: Left Pain - part of body: Knee      Time: 3086-5784 PT Time Calculation (min) (ACUTE ONLY): 17 min  Charges:  $Gait Training: 8-22 mins                     Drucilla Chalet, PT  Pager: [phone] Acute Rehab Dept [facility]): [phone]   03/20/2018    [facility] 03/20/2018, 11:27 AM

## 2018-03-20 NOTE — Progress Notes (Signed)
   Subjective: 3 Days Post-Op Procedure(s) (LRB): LEFT TOTAL KNEE ARTHROPLASTY REVISION (Left) Patient reports pain as still significant but slightly improved.   Plan is to go Home after hospital stay.  Objective: Vital signs in last 24 hours: Temp:  [98.3 F (36.8 C)-99.5 F (37.5 C)] 98.3 F (36.8 C) (11/16 0545) Pulse Rate:  [94-101] 100 (11/16 0545) Resp:  [16] 16 (11/15 1452) BP: (131-133)/(76-92) 132/76 (11/16 0545) SpO2:  [92 %-99 %] 94 % (11/16 0545)  Intake/Output from previous day:  Intake/Output Summary (Last 24 hours) at 03/20/2018 0746 Last data filed at 03/20/2018 0545 Gross per 24 hour  Intake 560 ml  Output -  Net 560 ml    Intake/Output this shift: No intake/output data recorded.  Labs: Recent Labs    03/18/18 0406 03/19/18 0438 03/20/18 0354  HGB 10.9* 10.4* 10.6*   Recent Labs    03/19/18 0438 03/20/18 0354  WBC 13.7* 9.4  RBC 3.45* 3.62*  HCT 33.4* 34.4*  PLT 296 309   Recent Labs    03/18/18 0406 03/19/18 0438  NA 140 139  K 3.8 4.1  CL 107 105  CO2 28 29  BUN 5* 7  CREATININE 0.58 0.55  GLUCOSE 128* 104*  CALCIUM 8.2* 8.1*   No results for input(s): LABPT, INR in the last 72 hours.  EXAM General - Patient is Alert, Appropriate and Oriented Extremity - Neurologically intact Neurovascular intact No cellulitis present Compartment soft Dressing/Incision - clean, dry, no drainage Motor Function - intact, moving foot and toes well on exam.   Past Medical History:  Diagnosis Date  . Anxiety   . Depression   . 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 PONV (postoperative nausea and vomiting)   . Stomach ulcer    hx of was d/t NSAID use, now resolved     Assessment/Plan: 3 Days Post-Op Procedure(s) (LRB): LEFT TOTAL KNEE ARTHROPLASTY REVISION (Left) Active Problems:   Failed total knee arthroplasty (HCC)   Up with therapy  Discharge home  DVT Prophylaxis - Xarelto Weight-Bearing as  tolerated to left leg  Ollen GrossFrank Cashel Bellina 03/20/2018, 7:46 AM

## 2018-03-20 NOTE — Progress Notes (Signed)
Discharged from floor via w/c for transport home by car. Belongings & daughter with pt. No changes in assessment. Felicia Gordon  

## 2018-03-22 NOTE — Discharge Summary (Signed)
Physician Discharge Summary   Patient ID: Felicia Gordon MRN: [id number] DOB/AGE: 47-May-1972 47 y.o.  Admit date: 03/17/2018 Discharge date: 03/20/2018  Primary Diagnosis: Failed unstable left total knee arthroplasty3   Admission Diagnoses:  Past Medical History:  Diagnosis Date  . Anxiety   . Depression   . 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 PONV (postoperative nausea and vomiting)   . Stomach ulcer    hx of was d/t NSAID use, now resolved    Discharge Diagnoses:   Active Problems:   Failed total knee arthroplasty ([place])  Estimated body mass index is 27.12 kg/m as calculated from the following:   Height as of this encounter: 5' 5"  (1.651 m).   Weight as of this encounter: 73.9 kg.  Procedure:  Procedure(s) (LRB): LEFT TOTAL KNEE ARTHROPLASTY REVISION (Left)   Consults: None  HPI: Felicia Gordon is a 47 year old female who had a total knee arthroplasty done at [facility] several years ago.  She presented to me a few years back with instability.  She had a polyethylene revision to a thicker polyethylene, which initially solved the instability problem, but she has had progressive pain and worsening and stability once again.  It is felt that at this point, the most predictable means of getting her better would be to revise the entire knee.  She presents today for total knee arthroplasty revision.  Laboratory Data: Admission on 03/17/2018, Discharged on 03/20/2018  Component Date Value Ref Range Status  . WBC 03/18/2018 15.1* 4.0 - 10.5 K/uL Final  . RBC 03/18/2018 3.62* 3.87 - 5.11 MIL/uL Final  . Hemoglobin 03/18/2018 10.9* 12.0 - 15.0 g/dL Final  . HCT 03/18/2018 34.9* 36.0 - 46.0 % Final  . MCV 03/18/2018 96.4  80.0 - 100.0 fL Final  . MCH 03/18/2018 30.1  26.0 - 34.0 pg Final  . MCHC 03/18/2018 31.2  30.0 - 36.0 g/dL Final  . RDW 03/18/2018 12.4  11.5 - 15.5 % Final  . Platelets 03/18/2018 310  150 - 400 K/uL Final  . nRBC  03/18/2018 0.0  0.0 - 0.2 % Final   Performed at [facility], [place] [address], [place], [place] [address]  . Sodium 03/18/2018 140  135 - 145 mmol/L Final  . Potassium 03/18/2018 3.8  3.5 - 5.1 mmol/L Final  . Chloride 03/18/2018 107  98 - 111 mmol/L Final  . CO2 03/18/2018 28  22 - 32 mmol/L Final  . Glucose, Bld 03/18/2018 128* 70 - 99 mg/dL Final  . BUN 03/18/2018 5* 6 - 20 mg/dL Final  . Creatinine, Ser 03/18/2018 0.58  0.44 - 1.00 mg/dL Final  . Calcium 03/18/2018 8.2* 8.9 - 10.3 mg/dL Final  . GFR calc non Af Amer 03/18/2018 >60  >60 mL/min Final  . GFR calc Af Amer 03/18/2018 >60  >60 mL/min Final   Comment: (NOTE) The eGFR has been calculated using the CKD EPI equation. This calculation has not been validated in all clinical situations. eGFR's persistently <60 mL/min signify possible Chronic Kidney Disease.   [name] gap 03/18/2018 5  5 - 15 Final   Performed at [facility], [place] [address], [place], [place] [address]  . WBC 03/19/2018 13.7* 4.0 - 10.5 K/uL Final  . RBC 03/19/2018 3.45* 3.87 - 5.11 MIL/uL Final  . Hemoglobin 03/19/2018 10.4* 12.0 - 15.0 g/dL Final  . HCT 03/19/2018 33.4* 36.0 - 46.0 % Final  . MCV 03/19/2018 96.8  80.0 - 100.0 fL Final  . MCH 03/19/2018 30.1  26.0 - 34.0 pg Final  . MCHC 03/19/2018 31.1  30.0 - 36.0 g/dL Final  . RDW 03/19/2018 12.7  11.5 - 15.5 % Final  . Platelets 03/19/2018 296  150 - 400 K/uL Final  . nRBC 03/19/2018 0.0  0.0 - 0.2 % Final   Performed at [facility], [place] [address] [place], [place] [address]  . Sodium 03/19/2018 139  135 - 145 mmol/L Final  . Potassium 03/19/2018 4.1  3.5 - 5.1 mmol/L Final  . Chloride 03/19/2018 105  98 - 111 mmol/L Final  . CO2 03/19/2018 29  22 - 32 mmol/L Final  . Glucose, Bld 03/19/2018 104* 70 - 99 mg/dL Final  . BUN 03/19/2018 7  6 - 20 mg/dL Final  . Creatinine, Ser 03/19/2018 0.55  0.44 - 1.00 mg/dL Final  . Calcium 03/19/2018 8.1*  8.9 - 10.3 mg/dL Final  . GFR calc non Af Amer 03/19/2018 >60  >60 mL/min Final  . GFR calc Af Amer 03/19/2018 >60  >60 mL/min Final   Comment: (NOTE) The eGFR has been calculated using the CKD EPI equation. This calculation has not been validated in all clinical situations. eGFR's persistently <60 mL/min signify possible Chronic Kidney Disease.   [name] gap 03/19/2018 5  5 - 15 Final   Performed at [facility], [place] [address], [place], [place] [address]  . WBC 03/20/2018 9.4  4.0 - 10.5 K/uL Final  . RBC 03/20/2018 3.62* 3.87 - 5.11 MIL/uL Final  . Hemoglobin 03/20/2018 10.6* 12.0 - 15.0 g/dL Final  . HCT 03/20/2018 34.4* 36.0 - 46.0 % Final  . MCV 03/20/2018 95.0  80.0 - 100.0 fL Final  . MCH 03/20/2018 29.3  26.0 - 34.0 pg Final  . MCHC 03/20/2018 30.8  30.0 - 36.0 g/dL Final  . RDW 03/20/2018 12.7  11.5 - 15.5 % Final  . Platelets 03/20/2018 309  150 - 400 K/uL Final  . nRBC 03/20/2018 0.0  0.0 - 0.2 % Final   Performed at [facility], [place] [address] [place], [place] [address]  Hospital Outpatient Visit on 03/12/2018  Component Date Value Ref Range Status  . MRSA, PCR 03/12/2018 NEGATIVE  NEGATIVE Final  . Staphylococcus aureus 03/12/2018 NEGATIVE  NEGATIVE Final   Comment: (NOTE) The Xpert SA Assay (FDA approved for NASAL specimens in patients 36 years of age and older), is one component of a comprehensive surveillance program. It is not intended to diagnose infection nor to guide or monitor treatment. Performed at [facility], [place] [address], [place], [place] [address]   . aPTT 03/12/2018 30  24 - 36 seconds Final   Performed at [facility], [place] [address], [place], [place] [address]  . WBC 03/12/2018 6.3  4.0 - 10.5 K/uL Final  . RBC 03/12/2018 4.76  3.87 - 5.11 MIL/uL Final  . Hemoglobin 03/12/2018 14.4  12.0 - 15.0 g/dL Final  . HCT 03/12/2018 45.0  36.0 - 46.0 % Final  . MCV 03/12/2018  94.5  80.0 - 100.0 fL Final  . MCH 03/12/2018 30.3  26.0 - 34.0 pg Final  . MCHC 03/12/2018 32.0  30.0 - 36.0 g/dL Final  . RDW 03/12/2018 12.1  11.5 - 15.5 % Final  . Platelets 03/12/2018 369  150 - 400 K/uL Final  . nRBC 03/12/2018 0.0  0.0 - 0.2 % Final   Performed at [facility]  [place] [address], [place], [place] [address]  . Sodium 03/12/2018 140  135 - 145 mmol/L Final  . Potassium 03/12/2018 3.8  3.5 - 5.1 mmol/L Final  . Chloride 03/12/2018 103  98 - 111 mmol/L Final  . CO2 03/12/2018 26  22 - 32 mmol/L Final  . Glucose, Bld 03/12/2018 95  70 - 99 mg/dL Final  . BUN 03/12/2018 7  6 - 20 mg/dL Final  . Creatinine, Ser 03/12/2018 0.77  0.44 - 1.00 mg/dL Final  . Calcium 03/12/2018 9.1  8.9 - 10.3 mg/dL Final  . Total Protein 03/12/2018 6.8  6.5 - 8.1 g/dL Final  . Albumin 03/12/2018 3.9  3.5 - 5.0 g/dL Final  . AST 03/12/2018 29  15 - 41 U/L Final  . ALT 03/12/2018 20  0 - 44 U/L Final  . Alkaline Phosphatase 03/12/2018 51  38 - 126 U/L Final  . Total Bilirubin 03/12/2018 0.6  0.3 - 1.2 mg/dL Final  . GFR calc non Af Amer 03/12/2018 >60  >60 mL/min Final  . GFR calc Af Amer 03/12/2018 >60  >60 mL/min Final   Comment: (NOTE) The eGFR has been calculated using the CKD EPI equation. This calculation has not been validated in all clinical situations. eGFR's persistently <60 mL/min signify possible Chronic Kidney Disease.   [name] gap 03/12/2018 11  5 - 15 Final   Performed at [facility], [place] [address] [place], [place] [address]  . Prothrombin Time 03/12/2018 12.6  11.4 - 15.2 seconds Final  . INR 03/12/2018 0.95   Final   Performed at [facility], [place] [address], [place], [place] [address]  . ABO/RH(D) 03/12/2018 O POS   Final  . Antibody Screen 03/12/2018 NEG   Final  . Sample Expiration 03/12/2018 03/20/2018   Final  . Extend sample reason 03/12/2018    Final                   Value:NO TRANSFUSIONS OR PREGNANCY  IN THE PAST 3 MONTHS Performed at [facility], [place] [address], [place], [place] [address]   . Preg, Serum 03/12/2018 NEGATIVE  NEGATIVE Final   Comment:        THE SENSITIVITY OF THIS METHODOLOGY IS >10 mIU/mL. Performed at [facility], [place] [address], [place],  [address]      X-Rays:No results found.  EKG:No orders found for this or any previous visit.   Hospital Course: Felicia Gordon is a 47 y.o. who was admitted to [facility]. They were brought to the operating room on 03/17/2018 and underwent Procedure(s): LEFT TOTAL KNEE ARTHROPLASTY REVISION.  Patient tolerated the procedure well and was later transferred to the recovery room and then to the orthopaedic floor for postoperative care. They were given PO and IV analgesics for pain control following their surgery. They were given 24 hours of postoperative antibiotics of  Anti-infectives (From admission, onward)   Start     Dose/Rate Route Frequency Ordered Stop   03/17/18 1430  ceFAZolin (ANCEF) IVPB 1 g/50 mL premix     1 g 100 mL/hr over 30 Minutes Intravenous Every 6 hours 03/17/18 1216 03/18/18 0725   03/17/18 0632  ceFAZolin (ANCEF) 2-4 GM/100ML-% IVPB    Note to Pharmacy:  Charmayne Sheer   : cabinet override      03/17/18 0632 03/17/18 0842   03/17/18 0630  ceFAZolin (ANCEF) IVPB 2g/100 mL premix  2 g 200 mL/hr over 30 Minutes Intravenous On call to O.R. 03/17/18 4765 03/17/18 0852     and started on DVT prophylaxis in the form of Xarelto.   PT and OT were ordered for total joint protocol. Discharge planning consulted to help with postop disposition and equipment needs. Patient had a decent night on the evening of surgery. They started to get up OOB with therapy on POD #1. Hemovac drain was pulled without difficulty on day one. Continued to work with therapy into POD #2. Pt was seen during rounds on day two and dressing was changed and the incision was clean, dry,  and intact with no drainage. On POD #3, patient was ready for discharge to home. Incision was healing well. Patient was reporting pain as significant but improving with each day. Pt worked with therapy for one additional session and was meeting their goals. She was discharged to home later that day in stable condition.  Diet: Regular diet Activity: WBAT Follow-up: in 2 weeks with Dr. Wynelle Link Disposition: Home with outpatient physical therapy at [facility] Discharged Condition: stable   Discharge Instructions    Call MD / Call 911   Complete by:  As directed    If you experience chest pain or shortness of breath, CALL 911 and be transported to the hospital emergency room.  If you develope a fever above 101 F, pus (white drainage) or increased drainage or redness at the wound, or calf pain, call your surgeon's office.   Change dressing   Complete by:  As directed    Change the dressing daily with sterile 4 x 4 inch gauze dressing and apply TED hose.   Constipation Prevention   Complete by:  As directed    Drink plenty of fluids.  Prune juice may be helpful.  You may use a stool softener, such as Colace (over the counter) 100 mg twice a day.  Use MiraLax (over the counter) for constipation as needed.   Diet - low sodium heart healthy   Complete by:  As directed    Discharge instructions   Complete by:  As directed    Dr. Gaynelle Arabian Total Joint Specialist [facility] [address] [place], [place] [address] [phone]  TOTAL KNEE REVISION POSTOPERATIVE DIRECTIONS  Knee Rehabilitation, Guidelines Following Surgery  Results after knee surgery are often greatly improved when you follow the exercise, range of motion and muscle strengthening exercises prescribed by your doctor. Safety measures are also important to protect the knee from further injury. Any time any of these exercises cause you to have increased pain or swelling in your knee joint, decrease the amount until you  are comfortable again and slowly increase them. If you have problems or questions, call your caregiver or physical therapist for advice.   HOME CARE INSTRUCTIONS  Remove items at home which could result in a fall. This includes throw rugs or furniture in walking pathways.  ICE to the affected knee every three hours for 30 minutes at a time and then as needed for pain and swelling.  Continue to use ice on the knee for pain and swelling from surgery. You may notice swelling that will progress down to the foot and ankle.  This is normal after surgery.  Elevate the leg when you are not up walking on it.   Continue to use the breathing machine which will help keep your temperature down.  It is common for your temperature to cycle up and down following  surgery, especially at night when you are not up moving around and exerting yourself.  The breathing machine keeps your lungs expanded and your temperature down. Do not place pillow under knee, focus on keeping the knee straight while resting   DIET You may resume your previous home diet once your are discharged from the hospital.  DRESSING / WOUND CARE / SHOWERING You may shower 3 days after surgery, but keep the wounds dry during showering.  You may use an occlusive plastic wrap (Press'n Seal for example), NO SOAKING/SUBMERGING IN THE BATHTUB.  If the bandage gets wet, change with a clean dry gauze.  If the incision gets wet, pat the wound dry with a clean towel. You may start showering once you are discharged home but do not submerge the incision under water. Just pat the incision dry and apply a dry gauze dressing on daily. Change the surgical dressing daily and reapply a dry dressing each time.  ACTIVITY Walk with your walker as instructed. Use walker as long as suggested by your caregivers. Avoid periods of inactivity such as sitting longer than an hour when not asleep. This helps prevent blood clots.  You may resume a sexual relationship in one  month or when given the OK by your doctor.  You may return to work once you are cleared by your doctor.  Do not drive a car for 6 weeks or until released by you surgeon.  Do not drive while taking narcotics.  WEIGHT BEARING Weight bearing as tolerated with assist device (walker, cane, etc) as directed, use it as long as suggested by your surgeon or therapist, typically at least 4-6 weeks.  POSTOPERATIVE CONSTIPATION PROTOCOL Constipation - defined medically as fewer than three stools per week and severe constipation as less than one stool per week.  One of the most common issues patients have following surgery is constipation.  Even if you have a regular bowel pattern at home, your normal regimen is likely to be disrupted due to multiple reasons following surgery.  Combination of anesthesia, postoperative narcotics, change in appetite and fluid intake all can affect your bowels.  In order to avoid complications following surgery, here are some recommendations in order to help you during your recovery period.  Colace (docusate) - Pick up an over-the-counter form of Colace or another stool softener and take twice a day as long as you are requiring postoperative pain medications.  Take with a full glass of water daily.  If you experience loose stools or diarrhea, hold the colace until you stool forms back up.  If your symptoms do not get better within 1 week or if they get worse, check with your doctor.  Dulcolax (bisacodyl) - Pick up over-the-counter and take as directed by the product packaging as needed to assist with the movement of your bowels.  Take with a full glass of water.  Use this product as needed if not relieved by Colace only.   MiraLax (polyethylene glycol) - Pick up over-the-counter to have on hand.  MiraLax is a solution that will increase the amount of water in your bowels to assist with bowel movements.  Take as directed and can mix with a glass of water, juice, soda, coffee, or  tea.  Take if you go more than two days without a movement. Do not use MiraLax more than once per day. Call your doctor if you are still constipated or irregular after using this medication for 7 days in a row.  If you  continue to have problems with postoperative constipation, please contact the office for further assistance and recommendations.  If you experience "the worst abdominal pain ever" or develop nausea or vomiting, please contact the office immediatly for further recommendations for treatment.  ITCHING  If you experience itching with your medications, try taking only a single pain pill, or even half a pain pill at a time.  You can also use Benadryl over the counter for itching or also to help with sleep.   TED HOSE STOCKINGS Wear the elastic stockings on both legs for three weeks following surgery during the day but you may remove then at night for sleeping.  MEDICATIONS See your medication summary on the "After Visit Summary" that the nursing staff will review with you prior to discharge.  You may have some home medications which will be placed on hold until you complete the course of blood thinner medication.  It is important for you to complete the blood thinner medication as prescribed by your surgeon.  Continue your approved medications as instructed at time of discharge.  PRECAUTIONS If you experience chest pain or shortness of breath - call 911 immediately for transfer to the hospital emergency department.  If you develop a fever greater that 101 F, purulent drainage from wound, increased redness or drainage from wound, foul odor from the wound/dressing, or calf pain - CONTACT YOUR SURGEON.                                                   FOLLOW-UP APPOINTMENTS Make sure you keep all of your appointments after your operation with your surgeon and caregivers. You should call the office at the above phone number and make an appointment for approximately two weeks after the date of  your surgery or on the date instructed by your surgeon outlined in the "After Visit Summary".   RANGE OF MOTION AND STRENGTHENING EXERCISES  Rehabilitation of the knee is important following a knee injury or an operation. After just a few days of immobilization, the muscles of the thigh which control the knee become weakened and shrink (atrophy). Knee exercises are designed to build up the tone and strength of the thigh muscles and to improve knee motion. Often times heat used for twenty to thirty minutes before working out will loosen up your tissues and help with improving the range of motion but do not use heat for the first two weeks following surgery. These exercises can be done on a training (exercise) mat, on the floor, on a table or on a bed. Use what ever works the best and is most comfortable for you Knee exercises include:  Leg Lifts - While your knee is still immobilized in a splint or cast, you can do straight leg raises. Lift the leg to 60 degrees, hold for 3 sec, and slowly lower the leg. Repeat 10-20 times 2-3 times daily. Perform this exercise against resistance later as your knee gets better.  Quad and Hamstring Sets - Tighten up the muscle on the front of the thigh (Quad) and hold for 5-10 sec. Repeat this 10-20 times hourly. Hamstring sets are done by pushing the foot backward against an object and holding for 5-10 sec. Repeat as with quad sets.  Leg Slides: Lying on your back, slowly slide your foot toward your buttocks, bending your knee up  off the floor (only go as far as is comfortable). Then slowly slide your foot back down until your leg is flat on the floor again. Angel Wings: Lying on your back spread your legs to the side as far apart as you can without causing discomfort.  A rehabilitation program following serious knee injuries can speed recovery and prevent re-injury in the future due to weakened muscles. Contact your doctor or a physical therapist for more information on  knee rehabilitation.   IF YOU ARE TRANSFERRED TO A SKILLED REHAB FACILITY If the patient is transferred to a skilled rehab facility following release from the hospital, a list of the current medications will be sent to the facility for the patient to continue.  When discharged from the skilled rehab facility, please have the facility set up the patient's [place] prior to being released. Also, the skilled facility will be responsible for providing the patient with their medications at time of release from the facility to include their pain medication, the muscle relaxants, and their blood thinner medication. If the patient is still at the rehab facility at time of the two week follow up appointment, the skilled rehab facility will also need to assist the patient in arranging follow up appointment in our office and any transportation needs.  MAKE SURE YOU:  Understand these instructions.  Get help right away if you are not doing well or get worse.    Pick up stool softner and laxative for home use following surgery while on pain medications. Do not submerge incision under water. Please use good hand washing techniques while changing dressing each day. May shower starting three days after surgery. Please use a clean towel to pat the incision dry following showers. Continue to use ice for pain and swelling after surgery. Do not use any lotions or creams on the incision until instructed by your surgeon.   Do not put a pillow under the knee. Place it under the heel.   Complete by:  As directed    Driving restrictions   Complete by:  As directed    No driving for two weeks   TED hose   Complete by:  As directed    Use stockings (TED hose) for three weeks on both leg(s).  You may remove them at night for sleeping.   Weight bearing as tolerated   Complete by:  As directed      Allergies as of 03/20/2018      Reactions   Marcaine [bupivacaine] Other (See Comments)   HAS HAD  CORTISONE INJECTION WITH INGREDIENT OF MARCAINE  AND WOULD ALWAYS GET FLUSHED AFTERWARD AND HAVE A HEADACHE; TRIALED CORTISONE WITHOUT MARCAINE AND HAD NO REACTION    Nsaids Other (See Comments)   DUE TO ULCERS      Medication List    STOP taking these medications   methocarbamol 500 MG tablet Commonly known as:  ROBAXIN     TAKE these medications   ALPRAZolam 1 MG tablet Commonly known as:  XANAX Take 0.5 mg by mouth at bedtime as needed for sleep.   buPROPion 150 MG 12 hr tablet Commonly known as:  WELLBUTRIN SR Take 150 mg by mouth daily.   diphenhydrAMINE 25 MG tablet Commonly known as:  BENADRYL Take 25 mg by mouth at bedtime.   FLUoxetine 20 MG capsule Commonly known as:  PROZAC Take 20 mg by mouth daily.   gabapentin 300 MG capsule Commonly known as:  NEURONTIN Take 1 capsule (  300 mg total) by mouth 2 (two) times daily. Take for three weeks, then discontinue the 300 mg dosing. What changed:    how much to take  when to take this   HYDROmorphone 4 MG tablet Commonly known as:  DILAUDID Take 1-2 tablets (4-8 mg total) by mouth every 6 (six) hours as needed for moderate pain. Take for post-operative moderate to severe pain. What changed:    when to take this  reasons to take this  additional instructions  Another medication with the same name was removed. Continue taking this medication, and follow the directions you see here.   hyoscyamine 0.125 MG tablet Commonly known as:  LEVSIN, ANASPAZ Take 0.125 mg by mouth every 6 (six) hours as needed for bladder spasms.   montelukast 10 MG tablet Commonly known as:  SINGULAIR Take 10 mg by mouth at bedtime.   rivaroxaban 10 MG Tabs tablet Commonly known as:  XARELTO Take 1 tablet (10 mg total) by mouth daily with breakfast for 19 days. What changed:  additional instructions   tiZANidine 4 MG tablet Commonly known as:  ZANAFLEX Take 1 tablet (4 mg total) by mouth every 6 (six) hours as needed for muscle  spasms.   traZODone 50 MG tablet Commonly known as:  DESYREL Take 50 mg by mouth at bedtime.            Discharge Care Instructions  (From admission, onward)         Start     Ordered   03/18/18 0000  Weight bearing as tolerated     03/18/18 0758   03/18/18 0000  Change dressing    Comments:  Change the dressing daily with sterile 4 x 4 inch gauze dressing and apply TED hose.   03/18/18 0758         Follow-up Information    Gaynelle Arabian, MD. Schedule an appointment as soon as possible for a visit on 03/30/2018.   Specialty:  Orthopedic Surgery Contact information: [address] [place] [address] [phone]           Signed: Theresa Duty, PA-C Orthopedic Surgery 03/22/2018, 12:57 PM
